# Patient Record
Sex: Male | Born: 1970
Health system: Southern US, Community
[De-identification: ages and names within clinical notes are randomized; demographics above are authoritative.]

## PROBLEM LIST (undated history)

## (undated) DIAGNOSIS — B019 Varicella without complication: Secondary | ICD-10-CM

## (undated) HISTORY — PX: VASECTOMY: SHX75

## (undated) HISTORY — DX: Varicella without complication: B01.9

---

## 2005-03-12 ENCOUNTER — Emergency Department (HOSPITAL_COMMUNITY): Admission: EM | Admit: 2005-03-12 | Discharge: 2005-03-12 | Payer: Self-pay | Admitting: Emergency Medicine

## 2005-12-23 ENCOUNTER — Emergency Department (HOSPITAL_COMMUNITY): Admission: EM | Admit: 2005-12-23 | Discharge: 2005-12-23 | Payer: Self-pay | Admitting: Emergency Medicine

## 2006-05-23 ENCOUNTER — Emergency Department (HOSPITAL_COMMUNITY): Admission: EM | Admit: 2006-05-23 | Discharge: 2006-05-23 | Payer: Self-pay | Admitting: *Deleted

## 2006-12-17 ENCOUNTER — Emergency Department (HOSPITAL_COMMUNITY): Admission: EM | Admit: 2006-12-17 | Discharge: 2006-12-17 | Payer: Self-pay | Admitting: Emergency Medicine

## 2009-03-13 ENCOUNTER — Emergency Department (HOSPITAL_BASED_OUTPATIENT_CLINIC_OR_DEPARTMENT_OTHER): Admission: EM | Admit: 2009-03-13 | Discharge: 2009-03-13 | Payer: Self-pay | Admitting: Emergency Medicine

## 2009-04-12 ENCOUNTER — Emergency Department (HOSPITAL_BASED_OUTPATIENT_CLINIC_OR_DEPARTMENT_OTHER): Admission: EM | Admit: 2009-04-12 | Discharge: 2009-04-12 | Payer: Self-pay | Admitting: Emergency Medicine

## 2009-04-15 ENCOUNTER — Emergency Department (HOSPITAL_BASED_OUTPATIENT_CLINIC_OR_DEPARTMENT_OTHER): Admission: EM | Admit: 2009-04-15 | Discharge: 2009-04-15 | Payer: Self-pay | Admitting: Emergency Medicine

## 2010-06-17 LAB — URINE CULTURE: Colony Count: NO GROWTH

## 2010-06-17 LAB — URINALYSIS, ROUTINE W REFLEX MICROSCOPIC
Glucose, UA: NEGATIVE mg/dL
Ketones, ur: NEGATIVE mg/dL
Nitrite: NEGATIVE
Protein, ur: NEGATIVE mg/dL
pH: 5.5 (ref 5.0–8.0)

## 2010-07-03 LAB — RAPID STREP SCREEN (MED CTR MEBANE ONLY): Streptococcus, Group A Screen (Direct): POSITIVE — AB

## 2012-08-05 ENCOUNTER — Ambulatory Visit: Payer: Self-pay | Admitting: Family Medicine

## 2012-08-13 ENCOUNTER — Telehealth: Payer: Self-pay | Admitting: *Deleted

## 2012-08-13 NOTE — Telephone Encounter (Signed)
Attempted to call pt to move appt time to earlier slot, lmovm for the pt to return my call. Offered 1pm and 2pm slot, okay to combine to 15 minutes slots for this pt per Dr. Beverely Low.

## 2012-08-14 ENCOUNTER — Ambulatory Visit: Payer: Self-pay | Admitting: Family Medicine

## 2012-08-21 ENCOUNTER — Encounter: Payer: Self-pay | Admitting: Family Medicine

## 2012-08-21 ENCOUNTER — Ambulatory Visit (INDEPENDENT_AMBULATORY_CARE_PROVIDER_SITE_OTHER): Payer: BC Managed Care – PPO | Admitting: Family Medicine

## 2012-08-21 VITALS — BP 130/80 | HR 72 | Temp 98.2°F | Ht 65.0 in | Wt 159.6 lb

## 2012-08-21 DIAGNOSIS — G4762 Sleep related leg cramps: Secondary | ICD-10-CM

## 2012-08-21 DIAGNOSIS — M25519 Pain in unspecified shoulder: Secondary | ICD-10-CM | POA: Insufficient documentation

## 2012-08-21 LAB — CBC WITH DIFFERENTIAL/PLATELET
Eosinophils Absolute: 0.1 10*3/uL (ref 0.0–0.7)
HCT: 45.8 % (ref 39.0–52.0)
Lymphs Abs: 3.1 10*3/uL (ref 0.7–4.0)
MCH: 33.4 pg (ref 26.0–34.0)
Monocytes Absolute: 0.8 10*3/uL (ref 0.1–1.0)
Neutro Abs: 4.7 10*3/uL (ref 1.7–7.7)
Neutrophils Relative %: 53 % (ref 43–77)
RBC: 4.82 MIL/uL (ref 4.22–5.81)
RDW: 13.6 % (ref 11.5–15.5)
WBC: 8.7 10*3/uL (ref 4.0–10.5)

## 2012-08-21 LAB — BASIC METABOLIC PANEL
Chloride: 107 mEq/L (ref 96–112)
Potassium: 4.2 mEq/L (ref 3.5–5.3)
Sodium: 140 mEq/L (ref 135–145)

## 2012-08-21 LAB — HEPATIC FUNCTION PANEL
ALT: 22 U/L (ref 0–53)
Albumin: 4.8 g/dL (ref 3.5–5.2)
Alkaline Phosphatase: 41 U/L (ref 39–117)
Bilirubin, Direct: 0.1 mg/dL (ref 0.0–0.3)
Total Bilirubin: 0.4 mg/dL (ref 0.3–1.2)
Total Protein: 7.5 g/dL (ref 6.0–8.3)

## 2012-08-21 MED ORDER — MELOXICAM 15 MG PO TABS: 15.0000 mg | ORAL_TABLET | Freq: Every day | ORAL | Status: DC

## 2012-08-21 NOTE — Patient Instructions (Addendum)
Schedule your complete physical at your convenience We'll notify you of your lab results Increase your water intake Increase your potassium- leafy greens, citrus fruits, bananas, etc Take the Mobic at dinner or before bed- take w/ food Heating pad as needed Try and improve your posture to take the pressure off your low back Call with any questions or concerns Welcome!  We're glad to have you! Have a great holiday!

## 2012-08-21 NOTE — Progress Notes (Signed)
  Subjective:    Patient ID: Philip Hart, male    DOB: [birth year], 42 y.o.   MRN: [id number]  HPI New to establish.  Previous MD- none.  Bilateral shoulder pain- pt has hx of injury to R shoulder (workman's comp) ~1 yr ago.  Now waking from sleep w/ pain in both arms.  No limitation of motion.  Pain improves once pt is up and moving.  R hand dominant.  No weakness or numbness of arms.  Is a side sleeper, mostly R side.  R leg cramp- occuring at night.  Typically after pointing toes.  No known calf injury.  Working outside regularly or in a warehouse.  Limited fruits and veggies in diet.  Pt drives heavy equipment w/ R leg.   Review of Systems For ROS see HPI     Objective:   Physical Exam  Vitals reviewed. Constitutional: He is oriented to person, place, and time. He appears well-developed and well-nourished. No distress.  Neck: Normal range of motion. Neck supple. No thyromegaly present.  Bilateral trap spasm  Cardiovascular: Normal rate, regular rhythm, normal heart sounds and intact distal pulses.   Pulmonary/Chest: Effort normal and breath sounds normal. No respiratory distress. He has no wheezes. He has no rales.  Musculoskeletal: Normal range of motion. He exhibits no edema and no tenderness (over shoulders bilaterally).  Full ROM of shoulders bilaterally Negative impingement signs bilaterally  Lymphadenopathy:    He has no cervical adenopathy.  Neurological: He is alert and oriented to person, place, and time. He has normal reflexes.  Skin: Skin is warm.          Assessment & Plan:

## 2012-08-21 NOTE — Assessment & Plan Note (Signed)
New.  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  Reviewed importance of K+ in diet.  Check labs.  Will follow.

## 2012-08-21 NOTE — Assessment & Plan Note (Signed)
New.  No pain on PE.  No limitation on ROM.  No impingement.  Suspect pt's AM pain is positional when sleeping.  Will start scheduled NSAID prior to bed and see if sxs improve.  If no better, will refer to ortho.  Pt expressed understanding and is in agreement w/ plan.

## 2012-08-25 ENCOUNTER — Encounter: Payer: Self-pay | Admitting: *Deleted

## 2012-10-15 ENCOUNTER — Ambulatory Visit (INDEPENDENT_AMBULATORY_CARE_PROVIDER_SITE_OTHER): Payer: BC Managed Care – PPO | Admitting: Family Medicine

## 2012-10-15 ENCOUNTER — Encounter: Payer: Self-pay | Admitting: Family Medicine

## 2012-10-15 VITALS — BP 130/80 | HR 66 | Temp 98.7°F | Ht 65.0 in | Wt 152.0 lb

## 2012-10-15 DIAGNOSIS — Z125 Encounter for screening for malignant neoplasm of prostate: Secondary | ICD-10-CM | POA: Insufficient documentation

## 2012-10-15 DIAGNOSIS — Z0000 Encounter for general adult medical examination without abnormal findings: Secondary | ICD-10-CM

## 2012-10-15 DIAGNOSIS — Z13220 Encounter for screening for lipoid disorders: Secondary | ICD-10-CM | POA: Insufficient documentation

## 2012-10-15 LAB — LIPID PANEL
Total CHOL/HDL Ratio: 3
Triglycerides: 80 mg/dL (ref 0.0–149.0)
VLDL: 16 mg/dL (ref 0.0–40.0)

## 2012-10-15 NOTE — Assessment & Plan Note (Signed)
PE WNL.  Reviewed labs from last visit.  Check cholesterol since pt is fasting.  Encouraged smoking cessation.  Anticipatory guidance provided.

## 2012-10-15 NOTE — Patient Instructions (Addendum)
Follow up in 1 year or as needed We'll notify you of your lab results and make any changes if needed Keep up the good work!  You look great! Quit smoking! Call with any questions or concerns Have a great summer!!!

## 2012-10-15 NOTE — Progress Notes (Signed)
  Subjective:    Patient ID: Philip Hart, male    DOB: May 20, 1970, 42 y.o.   MRN: [id number]  HPI CPE- no concerns today.   Review of Systems Patient reports no vision/hearing changes, anorexia, fever ,adenopathy, persistant/recurrent hoarseness, swallowing issues, chest pain, palpitations, edema, persistant/recurrent cough, hemoptysis, dyspnea (rest,exertional, paroxysmal nocturnal), gastrointestinal  bleeding (melena, rectal bleeding), abdominal pain, excessive heart burn, GU symptoms (dysuria, hematuria, voiding/incontinence issues) syncope, focal weakness, memory loss, numbness & tingling, skin/hair/nail changes, depression, anxiety, abnormal bruising/bleeding, musculoskeletal symptoms/signs.     Objective:   Physical Exam BP 130/80  Pulse 66  Temp(Src) 98.7 F (37.1 C) (Oral)  Ht 5\' 5"  (1.651 m)  Wt 152 lb (68.947 kg)  BMI 25.29 kg/m2  SpO2 97%  General Appearance:    Alert, cooperative, no distress, appears stated age  Head:    Normocephalic, without obvious abnormality, atraumatic  Eyes:    PERRL, conjunctiva/corneas clear, EOM's intact, fundi    benign, both eyes       Ears:    Normal TM's and external ear canals, both ears  Nose:   Nares normal, septum midline, mucosa normal, no drainage   or sinus tenderness  Throat:   Lips, mucosa, and tongue normal; teeth and gums normal  Neck:   Supple, symmetrical, trachea midline, no adenopathy;       thyroid:  No enlargement/tenderness/nodules  Back:     Symmetric, no curvature, ROM normal, no CVA tenderness  Lungs:     Clear to auscultation bilaterally, respirations unlabored  Chest wall:    No tenderness or deformity  Heart:    Regular rate and rhythm, S1 and S2 normal, no murmur, rub   or gallop  Abdomen:     Soft, non-tender, bowel sounds active all four quadrants,    no masses, no organomegaly  Genitalia:    Normal male without lesion, discharge or tenderness  Rectal:    Normal tone, normal prostate, no masses or  tenderness  Extremities:   Extremities normal, atraumatic, no cyanosis or edema  Pulses:   2+ and symmetric all extremities  Skin:   Skin color, texture, turgor normal, no rashes or lesions  Lymph nodes:   Cervical, supraclavicular, and axillary nodes normal  Neurologic:   CNII-XII intact. Normal strength, sensation and reflexes      throughout          Assessment & Plan:

## 2012-10-16 ENCOUNTER — Encounter: Payer: Self-pay | Admitting: *Deleted

## 2013-06-28 ENCOUNTER — Ambulatory Visit: Payer: BC Managed Care – PPO | Admitting: Family Medicine

## 2013-07-23 ENCOUNTER — Ambulatory Visit (INDEPENDENT_AMBULATORY_CARE_PROVIDER_SITE_OTHER): Payer: BC Managed Care – PPO | Admitting: Family Medicine

## 2013-07-23 ENCOUNTER — Encounter: Payer: Self-pay | Admitting: Family Medicine

## 2013-07-23 VITALS — BP 130/70 | HR 82 | Temp 99.1°F | Wt 155.0 lb

## 2013-07-23 DIAGNOSIS — R21 Rash and other nonspecific skin eruption: Secondary | ICD-10-CM

## 2013-07-23 MED ORDER — NYSTATIN-TRIAMCINOLONE 100000-0.1 UNIT/GM-% EX OINT: 1.0000 "application " | TOPICAL_OINTMENT | Freq: Two times a day (BID) | CUTANEOUS | Status: DC

## 2013-07-23 NOTE — Patient Instructions (Signed)

## 2013-07-23 NOTE — Progress Notes (Signed)
  Subjective:     Philip Hart is a 43 y.o. male who presents for evaluation of a rash involving the trunk and perirectal. Rash started several months ago.  Rash on chest is a patch of papules-- itchy--no otc med tried.  Rash on rectum in white/ papules Rash has not changed over time. Rash is pruritic. Associated symptoms: none. Patient denies: abdominal pain, arthralgia, congestion, cough, crankiness, decrease in appetite, decrease in energy level, fever, headache, irritability, myalgia, nausea, sore throat and vomiting. Patient has not had contacts with similar rash. Patient has not had new exposures (soaps, lotions, laundry detergents, foods, medications, plants, insects or animals).  The following portions of the patient's history were reviewed and updated as appropriate:  He  has a past medical history of Chicken pox. He  does not have any pertinent problems on file. He  has past surgical history that includes Vasectomy. His family history includes Alcohol abuse in his maternal grandfather; Diabetes in his mother; Heart disease in his father. He  reports that he has been smoking Cigarettes.  He has a 20 pack-year smoking history. He does not have any smokeless tobacco history on file. He reports that he drinks alcohol. He reports that he does not use illicit drugs. He has a current medication list which includes the following prescription(s): ibuprofen and nystatin-triamcinolone ointment. Current Outpatient Prescriptions on File Prior to Visit  Medication Sig Dispense Refill  . ibuprofen (ADVIL,MOTRIN) 200 MG tablet Take 200 mg by mouth every 6 (six) hours as needed for pain.       No current facility-administered medications on file prior to visit.   He has No Known Allergies..  Review of Systems Pertinent items are noted in HPI.    Objective:    BP 130/70  Pulse 82  Temp(Src) 99.1 F (37.3 C) (Oral)  Wt 155 lb (70.308 kg)  SpO2 96% General:  alert, cooperative and no distress   Skin:  papules on chest in a patch-- dry    rectum--+ papular white around rectum    face-- hypopigmentation R cheek  Assessment:    eczema and tinea corporis    Plan:    Medications: triamcinolone/nystatin. Written patient instruction given. Follow up in a few weeks. --prn Encouraged pt to use sunscreen on his face

## 2013-07-23 NOTE — Progress Notes (Signed)
Pre visit review using our clinic review tool, if applicable. No additional management support is needed unless otherwise documented below in the visit note. 

## 2013-07-26 ENCOUNTER — Telehealth: Payer: Self-pay | Admitting: Family Medicine

## 2013-07-26 NOTE — Telephone Encounter (Signed)
Relevant patient education assigned to patient using Emmi. ° °

## 2014-01-15 ENCOUNTER — Ambulatory Visit: Payer: BC Managed Care – PPO | Admitting: Internal Medicine

## 2014-01-18 ENCOUNTER — Ambulatory Visit (INDEPENDENT_AMBULATORY_CARE_PROVIDER_SITE_OTHER): Payer: 59 | Admitting: Family Medicine

## 2014-01-18 ENCOUNTER — Encounter: Payer: Self-pay | Admitting: Family Medicine

## 2014-01-18 VITALS — BP 120/88 | HR 67 | Temp 98.2°F | Resp 16 | Wt 160.1 lb

## 2014-01-18 DIAGNOSIS — H538 Other visual disturbances: Secondary | ICD-10-CM

## 2014-01-18 DIAGNOSIS — G5791 Unspecified mononeuropathy of right lower limb: Secondary | ICD-10-CM

## 2014-01-18 DIAGNOSIS — G5792 Unspecified mononeuropathy of left lower limb: Secondary | ICD-10-CM

## 2014-01-18 DIAGNOSIS — G5793 Unspecified mononeuropathy of bilateral lower limbs: Secondary | ICD-10-CM

## 2014-01-18 LAB — BASIC METABOLIC PANEL
BUN: 11 mg/dL (ref 6–23)
CO2: 21 meq/L (ref 19–32)
Calcium: 10.5 mg/dL (ref 8.4–10.5)
Chloride: 106 mEq/L (ref 96–112)
Creatinine, Ser: 1.4 mg/dL (ref 0.4–1.5)
GFR: 60.44 mL/min (ref >60.00)
Glucose, Bld: 95 mg/dL (ref 70–99)
POTASSIUM: 4.7 meq/L (ref 3.5–5.1)
SODIUM: 142 meq/L (ref 135–145)

## 2014-01-18 LAB — TSH: TSH: 1.32 u[IU]/mL (ref 0.35–4.50)

## 2014-01-18 LAB — CBC WITH DIFFERENTIAL/PLATELET
BASOS ABS: 0 10*3/uL (ref 0.0–0.1)
BASOS PCT: 0.6 % (ref 0.0–3.0)
EOS ABS: 0.1 10*3/uL (ref 0.0–0.7)
EOS PCT: 0.9 % (ref 0.0–5.0)
HCT: 51.3 % (ref 39.0–52.0)
Hemoglobin: 17.1 g/dL — ABNORMAL HIGH (ref 13.0–17.0)
LYMPHS ABS: 2.4 10*3/uL (ref 0.7–4.0)
LYMPHS PCT: 38.4 % (ref 12.0–46.0)
MCHC: 33.3 g/dL (ref 30.0–36.0)
MCV: 98.8 fl (ref 78.0–100.0)
MONO ABS: 0.7 10*3/uL (ref 0.1–1.0)
Monocytes Relative: 11 % (ref 3.0–12.0)
NEUTROS PCT: 49.1 % (ref 43.0–77.0)
Neutro Abs: 3.1 10*3/uL (ref 1.4–7.7)
Platelets: 157 10*3/uL (ref 150.0–400.0)
RBC: 5.19 Mil/uL (ref 4.22–5.81)
RDW: 13.2 % (ref 11.5–15.5)
WBC: 6.3 10*3/uL (ref 4.0–10.5)

## 2014-01-18 LAB — HEMOGLOBIN A1C: HEMOGLOBIN A1C: 5.7 % (ref 4.6–6.5)

## 2014-01-18 LAB — B12 AND FOLATE PANEL
Folate: 9 ng/mL (ref >5.9)
Vitamin B-12: 347 pg/mL (ref 211–911)

## 2014-01-18 MED ORDER — PREDNISONE 10 MG PO TABS: ORAL_TABLET | ORAL | Status: DC

## 2014-01-18 NOTE — Assessment & Plan Note (Signed)
New.  Pt's acute onset and complete resolution is concerning.  Differential dx is broad including TIA, MS, and other neuro conditions.  Despite normal vision and pt being asymptomatic since episode last week, urgent evaluation by ophtho is recommended.  Pt expressed understanding and is in agreement w/ plan.

## 2014-01-18 NOTE — Assessment & Plan Note (Signed)
New.  Suspect this is meralgia paresthetica due to harness worn during hunting.  No red flags on PE- no muscle weakness, abnormal reflexes, abnormal gait.  Check labs to r/o other cause of neuropathy- DM, electrolyte abnormality, anemia, B12/folate deficiency.  Start short pred taper to relieve inflammation.  If no improvement, pt to call for referral to neuro.  Pt expressed understanding and is in agreement w/ plan.

## 2014-01-18 NOTE — Patient Instructions (Signed)
Schedule your complete physical in 3-4 months We'll notify you of your lab results and make any changes if needed Stop up front and they will give you the place and time of your eye exam Start the Prednisone as directed- take w/ food to avoid upset stomach Call with any questions or concerns Hang in there!!!

## 2014-01-18 NOTE — Progress Notes (Signed)
   Subjective:    Patient ID: Philip Hart, male    DOB: [birth year], 43 y.o.   MRN: [id number]  HPI Bilateral leg pain- pt reports bilateral thigh pain, described as a burning that radiates between lower and upper thighs.  Intermittently will have sharp, shooting pains, 'like being stabbed from the inside'.  Painful to have pants rub against leg.  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  Blurry vision- pt reports that while driving last week developed sudden onset blurry vision, sxs resolved within the hour.  Blurry vision was bilateral.  No eye pain.  No eye injury or bright lights.  Blurry vision has not recurred.   Review of Systems For ROS see HPI     Objective:   Physical Exam  Vitals reviewed. Constitutional: He is oriented to person, place, and time. He appears well-developed and well-nourished. No distress.  HENT:  Head: Normocephalic and atraumatic.  Eyes: Conjunctivae and EOM are normal. Pupils are equal, round, and reactive to light. Right eye exhibits no discharge. Left eye exhibits no discharge.  Vision 20/25 bilaterally  Cardiovascular: Intact distal pulses.   Musculoskeletal: He exhibits no edema.  Neurological: He is alert and oriented to person, place, and time. He has normal reflexes. No cranial nerve deficit. Coordination normal.  TTP w/ light touch over lateral thighs bilaterally constant w/ lateral cutaneous nerve distribution Strength of hamstrings and quads 5/5 bilaterally  Skin: Skin is warm and dry.          Assessment & Plan:

## 2014-01-18 NOTE — Progress Notes (Signed)
Pre visit review using our clinic review tool, if applicable. No additional management support is needed unless otherwise documented below in the visit note. 

## 2014-01-19 ENCOUNTER — Telehealth: Payer: Self-pay | Admitting: Family Medicine

## 2014-01-19 ENCOUNTER — Other Ambulatory Visit: Payer: Self-pay | Admitting: Family Medicine

## 2014-01-19 DIAGNOSIS — D582 Other hemoglobinopathies: Secondary | ICD-10-CM

## 2014-01-19 NOTE — Telephone Encounter (Signed)
Pt is returning your call, states you can leave him a message on his vm with his labs results.

## 2014-01-19 NOTE — Telephone Encounter (Signed)
Caller name: Molli HazardMatthew  Call back number: [phone]   Reason for call:  Pt would like lab results.   Pt would like to know how long the Rx predniSONE (DELTASONE) 10 MG tablet takes to work.

## 2014-01-19 NOTE — Telephone Encounter (Signed)
Pt notified of results and advised that prednisone can take a day or two to begin working.

## 2014-02-02 ENCOUNTER — Other Ambulatory Visit: Payer: 59

## 2014-05-26 ENCOUNTER — Encounter: Payer: 59 | Admitting: Family Medicine

## 2014-06-10 ENCOUNTER — Encounter: Payer: 59 | Admitting: Family Medicine

## 2014-09-07 ENCOUNTER — Telehealth: Payer: Self-pay | Admitting: Family Medicine

## 2014-09-07 NOTE — Telephone Encounter (Signed)
Pre Visit letter sent  °

## 2014-09-26 ENCOUNTER — Telehealth: Payer: Self-pay | Admitting: Behavioral Health

## 2014-09-26 NOTE — Telephone Encounter (Signed)
Unable to reach patient at time of Pre-Visit Call.  Left message for patient to return call when available.    

## 2014-09-27 ENCOUNTER — Encounter: Payer: Self-pay | Admitting: Family Medicine

## 2014-09-27 ENCOUNTER — Ambulatory Visit (INDEPENDENT_AMBULATORY_CARE_PROVIDER_SITE_OTHER): Payer: 59 | Admitting: Family Medicine

## 2014-09-27 VITALS — BP 124/82 | HR 71 | Temp 98.1°F | Resp 16 | Ht 65.0 in | Wt 152.0 lb

## 2014-09-27 DIAGNOSIS — Z0000 Encounter for general adult medical examination without abnormal findings: Secondary | ICD-10-CM

## 2014-09-27 LAB — CBC WITH DIFFERENTIAL/PLATELET
BASOS PCT: 0.6 % (ref 0.0–3.0)
Basophils Absolute: 0 10*3/uL (ref 0.0–0.1)
EOS PCT: 0.9 % (ref 0.0–5.0)
Eosinophils Absolute: 0.1 10*3/uL (ref 0.0–0.7)
HCT: 50 % (ref 39.0–52.0)
Hemoglobin: 17.1 g/dL — ABNORMAL HIGH (ref 13.0–17.0)
Lymphocytes Relative: 32.1 % (ref 12.0–46.0)
Lymphs Abs: 2.3 10*3/uL (ref 0.7–4.0)
MCHC: 34.2 g/dL (ref 30.0–36.0)
MCV: 97.4 fl (ref 78.0–100.0)
MONO ABS: 0.7 10*3/uL (ref 0.1–1.0)
Monocytes Relative: 10.4 % (ref 3.0–12.0)
NEUTROS PCT: 56 % (ref 43.0–77.0)
Neutro Abs: 4 10*3/uL (ref 1.4–7.7)
Platelets: 128 10*3/uL — ABNORMAL LOW (ref 150.0–400.0)
RBC: 5.14 Mil/uL (ref 4.22–5.81)
RDW: 13.1 % (ref 11.5–15.5)
WBC: 7.2 10*3/uL (ref 4.0–10.5)

## 2014-09-27 LAB — LIPID PANEL
CHOLESTEROL: 211 mg/dL — AB (ref 0–200)
HDL: 62.2 mg/dL (ref >39.00)
LDL Cholesterol: 118 mg/dL — ABNORMAL HIGH (ref 0–99)
NONHDL: 148.8
Total CHOL/HDL Ratio: 3
Triglycerides: 156 mg/dL — ABNORMAL HIGH (ref 0.0–149.0)
VLDL: 31.2 mg/dL (ref 0.0–40.0)

## 2014-09-27 LAB — BASIC METABOLIC PANEL
BUN: 9 mg/dL (ref 6–23)
CALCIUM: 9.8 mg/dL (ref 8.4–10.5)
CO2: 29 mEq/L (ref 19–32)
CREATININE: 1.06 mg/dL (ref 0.40–1.50)
Chloride: 101 mEq/L (ref 96–112)
GFR: 81 mL/min (ref >60.00)
Glucose, Bld: 95 mg/dL (ref 70–99)
Potassium: 4.4 mEq/L (ref 3.5–5.1)
SODIUM: 136 meq/L (ref 135–145)

## 2014-09-27 LAB — HEPATIC FUNCTION PANEL
ALT: 24 U/L (ref 0–53)
AST: 22 U/L (ref 0–37)
Albumin: 4.6 g/dL (ref 3.5–5.2)
Alkaline Phosphatase: 47 U/L (ref 39–117)
BILIRUBIN DIRECT: 0.1 mg/dL (ref 0.0–0.3)
BILIRUBIN TOTAL: 0.6 mg/dL (ref 0.2–1.2)
TOTAL PROTEIN: 8 g/dL (ref 6.0–8.3)

## 2014-09-27 LAB — TSH: TSH: 1.74 u[IU]/mL (ref 0.35–4.50)

## 2014-09-27 MED ORDER — TRIAMCINOLONE ACETONIDE 0.1 % EX OINT: 1.0000 "application " | TOPICAL_OINTMENT | Freq: Two times a day (BID) | CUTANEOUS | Status: DC

## 2014-09-27 NOTE — Patient Instructions (Signed)
Follow up in 1 year or as needed We'll notify you of your lab results and make any changes if needed Use the cream for the rash on the chest twice daily until it improves and then as needed if it returns QUIT SMOKING!! Call with any questions or concerns Have a great summer!!!

## 2014-09-27 NOTE — Progress Notes (Signed)
   Subjective:    Patient ID: Philip Hart, male    DOB: [birth year], 44 y.o.   MRN: [id number]  HPI CPE- no concerns today.   Review of Systems Patient reports no vision/hearing changes, anorexia, fever ,adenopathy, persistant/recurrent hoarseness, swallowing issues, chest pain, palpitations, edema, persistant/recurrent cough, hemoptysis, dyspnea (rest,exertional, paroxysmal nocturnal), gastrointestinal  bleeding (melena, rectal bleeding), abdominal pain, excessive heart burn, GU symptoms (dysuria, hematuria, voiding/incontinence issues) syncope, focal weakness, memory loss, numbness & tingling, hair/nail changes, depression, anxiety, abnormal bruising/bleeding, musculoskeletal symptoms/signs.   + rash on central chest- appears in heat and sweat, not itchy.    Objective:   Physical Exam General Appearance:    Alert, cooperative, no distress, appears stated age  Head:    Normocephalic, without obvious abnormality, atraumatic  Eyes:    PERRL, conjunctiva/corneas clear, EOM's intact, fundi    benign, both eyes       Ears:    Normal TM's and external ear canals, both ears  Nose:   Nares normal, septum midline, mucosa normal, no drainage   or sinus tenderness  Throat:   Lips, mucosa, and tongue normal; teeth and gums normal  Neck:   Supple, symmetrical, trachea midline, no adenopathy;       thyroid:  No enlargement/tenderness/nodules  Back:     Symmetric, no curvature, ROM normal, no CVA tenderness  Lungs:     Clear to auscultation bilaterally, respirations unlabored  Chest wall:    No tenderness or deformity  Heart:    Regular rate and rhythm, S1 and S2 normal, no murmur, rub   or gallop  Abdomen:     Soft, non-tender, bowel sounds active all four quadrants,    no masses, no organomegaly  Genitalia:    Normal male without lesion, masses,discharge or tenderness  Rectal:    Deferred due to young age  Extremities:   Extremities normal, atraumatic, no cyanosis or edema  Pulses:   2+ and  symmetric all extremities  Skin:   Skin color, texture, turgor normal, localized eczematous rash on central chest  Lymph nodes:   Cervical, supraclavicular, and axillary nodes normal  Neurologic:   CNII-XII intact. Normal strength, sensation and reflexes      throughout          Assessment & Plan:

## 2014-09-27 NOTE — Progress Notes (Signed)
Pre visit review using our clinic review tool, if applicable. No additional management support is needed unless otherwise documented below in the visit note. 

## 2014-09-28 ENCOUNTER — Encounter: Payer: Self-pay | Admitting: General Practice

## 2014-09-28 NOTE — Assessment & Plan Note (Signed)
Pt's PE WNL w/ exception of rash on central chest.  Start topical steroid ointment for what appears to be eczema.  Check labs.  Again encouraged smoking cessation.  Anticipatory guidance provided.

## 2015-06-21 DIAGNOSIS — L219 Seborrheic dermatitis, unspecified: Secondary | ICD-10-CM | POA: Diagnosis not present

## 2015-06-21 MED FILL — KETOCONAZOLE 2% SHAMPOO: 2 | 20 days supply | Qty: 120 | Fill #0

## 2015-06-21 MED FILL — CICLOPIROX 0.77% CREAM: 0.77 | 30 days supply | Qty: 90 | Fill #0

## 2015-06-27 MED FILL — SSD 1% CREAM: 1 | 30 days supply | Qty: 50 | Fill #0

## 2015-07-18 ENCOUNTER — Ambulatory Visit (INDEPENDENT_AMBULATORY_CARE_PROVIDER_SITE_OTHER): Payer: 59 | Admitting: Internal Medicine

## 2015-07-18 ENCOUNTER — Encounter: Payer: Self-pay | Admitting: Internal Medicine

## 2015-07-18 ENCOUNTER — Other Ambulatory Visit (INDEPENDENT_AMBULATORY_CARE_PROVIDER_SITE_OTHER): Payer: 59

## 2015-07-18 VITALS — BP 132/82 | HR 66 | Temp 98.4°F | Resp 18 | Ht 63.0 in | Wt 145.0 lb

## 2015-07-18 DIAGNOSIS — Z0000 Encounter for general adult medical examination without abnormal findings: Secondary | ICD-10-CM

## 2015-07-18 DIAGNOSIS — Z720 Tobacco use: Secondary | ICD-10-CM | POA: Diagnosis not present

## 2015-07-18 LAB — COMPREHENSIVE METABOLIC PANEL
ALBUMIN: 4.7 g/dL (ref 3.5–5.2)
ALK PHOS: 44 U/L (ref 39–117)
ALT: 17 U/L (ref 0–53)
AST: 19 U/L (ref 0–37)
BILIRUBIN TOTAL: 0.6 mg/dL (ref 0.2–1.2)
BUN: 19 mg/dL (ref 6–23)
CO2: 31 mEq/L (ref 19–32)
CREATININE: 1.31 mg/dL (ref 0.40–1.50)
Calcium: 10.1 mg/dL (ref 8.4–10.5)
Chloride: 102 mEq/L (ref 96–112)
GFR: 63.2 mL/min (ref >60.00)
GLUCOSE: 101 mg/dL — AB (ref 70–99)
Potassium: 4.3 mEq/L (ref 3.5–5.1)
SODIUM: 139 meq/L (ref 135–145)
TOTAL PROTEIN: 8 g/dL (ref 6.0–8.3)

## 2015-07-18 LAB — CBC
HEMATOCRIT: 48.5 % (ref 39.0–52.0)
HEMOGLOBIN: 16.9 g/dL (ref 13.0–17.0)
MCHC: 34.8 g/dL (ref 30.0–36.0)
MCV: 96.2 fl (ref 78.0–100.0)
PLATELETS: 137 10*3/uL — AB (ref 150.0–400.0)
RBC: 5.04 Mil/uL (ref 4.22–5.81)
RDW: 13 % (ref 11.5–15.5)
WBC: 10 10*3/uL (ref 4.0–10.5)

## 2015-07-18 LAB — LIPID PANEL
CHOLESTEROL: 207 mg/dL — AB (ref 0–200)
HDL: 70.1 mg/dL (ref >39.00)
LDL Cholesterol: 121 mg/dL — ABNORMAL HIGH (ref 0–99)
NONHDL: 137.29
Total CHOL/HDL Ratio: 3
Triglycerides: 80 mg/dL (ref 0.0–149.0)
VLDL: 16 mg/dL (ref 0.0–40.0)

## 2015-07-18 LAB — TSH: TSH: 2.29 u[IU]/mL (ref 0.35–4.50)

## 2015-07-18 NOTE — Progress Notes (Signed)
Pre visit review using our clinic review tool, if applicable. No additional management support is needed unless otherwise documented below in the visit note. 

## 2015-07-18 NOTE — Patient Instructions (Signed)
We have checked the EKG of the heart which shows that it looks perfectly normal. We are checking the labs today and call you back about the results.   Think about quitting smoker as this is one of the biggest causes of heart attack and stroke for you.   Think about using a goal for money that you are spending on cigarettes to treat yourself.  Smoking Cessation, Tips for Success If you are ready to quit smoking, congratulations! You have chosen to help yourself be healthier. Cigarettes bring nicotine, tar, carbon monoxide, and other irritants into your body. Your lungs, heart, and blood vessels will be able to work better without these poisons. There are many different ways to quit smoking. Nicotine gum, nicotine patches, a nicotine inhaler, or nicotine nasal spray can help with physical craving. Hypnosis, support groups, and medicines help break the habit of smoking. WHAT THINGS CAN I DO TO MAKE QUITTING EASIER?  Here are some tips to help you quit for good:  Pick a date when you will quit smoking completely. Tell all of your friends and family about your plan to quit on that date.  Do not try to slowly cut down on the number of cigarettes you are smoking. Pick a quit date and quit smoking completely starting on that day.  Throw away all cigarettes.   Clean and remove all ashtrays from your home, work, and car.  On a card, write down your reasons for quitting. Carry the card with you and read it when you get the urge to smoke.  Cleanse your body of nicotine. Drink enough water and fluids to keep your urine clear or pale yellow. Do this after quitting to flush the nicotine from your body.  Learn to predict your moods. Do not let a bad situation be your excuse to have a cigarette. Some situations in your life might tempt you into wanting a cigarette.  Never have "just one" cigarette. It leads to wanting another and another. Remind yourself of your decision to quit.  Change habits associated  with smoking. If you smoked while driving or when feeling stressed, try other activities to replace smoking. Stand up when drinking your coffee. Brush your teeth after eating. Sit in a different chair when you read the paper. Avoid alcohol while trying to quit, and try to drink fewer caffeinated beverages. Alcohol and caffeine may urge you to smoke.  Avoid foods and drinks that can trigger a desire to smoke, such as sugary or spicy foods and alcohol.  Ask people who smoke not to smoke around you.  Have something planned to do right after eating or having a cup of coffee. For example, plan to take a walk or exercise.  Try a relaxation exercise to calm you down and decrease your stress. Remember, you may be tense and nervous for the first 2 weeks after you quit, but this will pass.  Find new activities to keep your hands busy. Play with a pen, coin, or rubber band. Doodle or draw things on paper.  Brush your teeth right after eating. This will help cut down on the craving for the taste of tobacco after meals. You can also try mouthwash.   Use oral substitutes in place of cigarettes. Try using lemon drops, carrots, cinnamon sticks, or chewing gum. Keep them handy so they are available when you have the urge to smoke.  When you have the urge to smoke, try deep breathing.  Designate your home as a nonsmoking area.  If you are a heavy smoker, ask your health care provider about a prescription for nicotine chewing gum. It can ease your withdrawal from nicotine.  Reward yourself. Set aside the cigarette money you save and buy yourself something nice.  Look for support from others. Join a support group or smoking cessation program. Ask someone at home or at work to help you with your plan to quit smoking.  Always ask yourself, "Do I need this cigarette or is this just a reflex?" Tell yourself, "Today, I choose not to smoke," or "I do not want to smoke." You are reminding yourself of your decision  to quit.  Do not replace cigarette smoking with electronic cigarettes (commonly called e-cigarettes). The safety of e-cigarettes is unknown, and some may contain harmful chemicals.  If you relapse, do not give up! Plan ahead and think about what you will do the next time you get the urge to smoke. HOW WILL I FEEL WHEN I QUIT SMOKING? You may have symptoms of withdrawal because your body is used to nicotine (the addictive substance in cigarettes). You may crave cigarettes, be irritable, feel very hungry, cough often, get headaches, or have difficulty concentrating. The withdrawal symptoms are only temporary. They are strongest when you first quit but will go away within 10-14 days. When withdrawal symptoms occur, stay in control. Think about your reasons for quitting. Remind yourself that these are signs that your body is healing and getting used to being without cigarettes. Remember that withdrawal symptoms are easier to treat than the major diseases that smoking can cause.  Even after the withdrawal is over, expect periodic urges to smoke. However, these cravings are generally short lived and will go away whether you smoke or not. Do not smoke! WHAT RESOURCES ARE AVAILABLE TO HELP ME QUIT SMOKING? Your health care provider can direct you to community resources or hospitals for support, which may include:  Group support.  Education.  Hypnosis.  Therapy.   This information is not intended to replace advice given to you by your health care provider. Make sure you discuss any questions you have with your health care provider.   Document Released: 12/15/2003 Document Revised: 04/08/2014 Document Reviewed: 09/03/2012 Elsevier Interactive Patient Education 2016 [employer]. Health Maintenance, Male A healthy lifestyle and preventative care can promote health and wellness.  Maintain regular health, dental, and eye exams.  Eat a healthy diet. Foods like vegetables, fruits, whole grains, low-fat  dairy products, and lean protein foods contain the nutrients you need and are low in calories. Decrease your intake of foods high in solid fats, added sugars, and salt. Get information about a proper diet from your health care provider, if necessary.  Regular physical exercise is one of the most important things you can do for your health. Most adults should get at least 150 minutes of moderate-intensity exercise (any activity that increases your heart rate and causes you to sweat) each week. In addition, most adults need muscle-strengthening exercises on 2 or more days a week.   Maintain a healthy weight. The body mass index (BMI) is a screening tool to identify possible weight problems. It provides an estimate of body fat based on height and weight. Your health care provider can find your BMI and can help you achieve or maintain a healthy weight. For males 20 years and older:  A BMI below 18.5 is considered underweight.  A BMI of 18.5 to 24.9 is normal.  A BMI of 25 to 29.9 is considered  overweight.  A BMI of 30 and above is considered obese.  Maintain normal blood lipids and cholesterol by exercising and minimizing your intake of saturated fat. Eat a balanced diet with plenty of fruits and vegetables. Blood tests for lipids and cholesterol should begin at age 44 and be repeated every 5 years. If your lipid or cholesterol levels are high, you are over age 69, or you are at high risk for heart disease, you may need your cholesterol levels checked more frequently.Ongoing high lipid and cholesterol levels should be treated with medicines if diet and exercise are not working.  If you smoke, find out from your health care provider how to quit. If you do not use tobacco, do not start.  Lung cancer screening is recommended for adults aged 55-80 years who are at high risk for developing lung cancer because of a history of smoking. A yearly low-dose CT scan of the lungs is recommended for people who  have at least a 30-pack-year history of smoking and are current smokers or have quit within the past 15 years. A pack year of smoking is smoking an average of 1 pack of cigarettes a day for 1 year (for example, a 30-pack-year history of smoking could mean smoking 1 pack a day for 30 years or 2 packs a day for 15 years). Yearly screening should continue until the smoker has stopped smoking for at least 15 years. Yearly screening should be stopped for people who develop a health problem that would prevent them from having lung cancer treatment.  If you choose to drink alcohol, do not have more than 2 drinks per day. One drink is considered to be 12 oz (360 mL) of beer, 5 oz (150 mL) of wine, or 1.5 oz (45 mL) of liquor.  Avoid the use of street drugs. Do not share needles with anyone. Ask for help if you need support or instructions about stopping the use of drugs.  High blood pressure causes heart disease and increases the risk of stroke. High blood pressure is more likely to develop in:  People who have blood pressure in the end of the normal range (100-139/85-89 mm Hg).  People who are overweight or obese.  People who are African American.  If you are 65-90 years of age, have your blood pressure checked every 3-5 years. If you are 60 years of age or older, have your blood pressure checked every year. You should have your blood pressure measured twice--once when you are at a hospital or clinic, and once when you are not at a hospital or clinic. Record the average of the two measurements. To check your blood pressure when you are not at a hospital or clinic, you can use:  An automated blood pressure machine at a pharmacy.  A home blood pressure monitor.  If you are 68-61 years old, ask your health care provider if you should take aspirin to prevent heart disease.  Diabetes screening involves taking a blood sample to check your fasting blood sugar level. This should be done once every 3 years  after age 33 if you are at a normal weight and without risk factors for diabetes. Testing should be considered at a younger age or be carried out more frequently if you are overweight and have at least 1 risk factor for diabetes.  Colorectal cancer can be detected and often prevented. Most routine colorectal cancer screening begins at the age of 60 and continues through age 70. However, your health care  provider may recommend screening at an earlier age if you have risk factors for colon cancer. On a yearly basis, your health care provider may provide home test kits to check for hidden blood in the stool. A small camera at the end of a tube may be used to directly examine the colon (sigmoidoscopy or colonoscopy) to detect the earliest forms of colorectal cancer. Talk to your health care provider about this at age 38 when routine screening begins. A direct exam of the colon should be repeated every 5-10 years through age 47, unless early forms of precancerous polyps or small growths are found.  People who are at an increased risk for hepatitis B should be screened for this virus. You are considered at high risk for hepatitis B if:  You were born in a country where hepatitis B occurs often. Talk with your health care provider about which countries are considered high risk.  Your parents were born in a high-risk country and you have not received a shot to protect against hepatitis B (hepatitis B vaccine).  You have HIV or AIDS.  You use needles to inject street drugs.  You live with, or have sex with, someone who has hepatitis B.  You are a man who has sex with other men (MSM).  You get hemodialysis treatment.  You take certain medicines for conditions like cancer, organ transplantation, and autoimmune conditions.  Hepatitis C blood testing is recommended for all people born from [birth year] through 1964 and any individual with known risk factors for hepatitis C.  Healthy men should no longer receive  prostate-specific antigen (PSA) blood tests as part of routine cancer screening. Talk to your health care provider about prostate cancer screening.  Testicular cancer screening is not recommended for adolescents or adult males who have no symptoms. Screening includes self-exam, a health care provider exam, and other screening tests. Consult with your health care provider about any symptoms you have or any concerns you have about testicular cancer.  Practice safe sex. Use condoms and avoid high-risk sexual practices to reduce the spread of sexually transmitted infections (STIs).  You should be screened for STIs, including gonorrhea and chlamydia if:  You are sexually active and are younger than 24 years.  You are older than 24 years, and your health care provider tells you that you are at risk for this type of infection.  Your sexual activity has changed since you were last screened, and you are at an increased risk for chlamydia or gonorrhea. Ask your health care provider if you are at risk.  If you are at risk of being infected with HIV, it is recommended that you take a prescription medicine daily to prevent HIV infection. This is called pre-exposure prophylaxis (PrEP). You are considered at risk if:  You are a man who has sex with other men (MSM).  You are a heterosexual man who is sexually active with multiple partners.  You take drugs by injection.  You are sexually active with a partner who has HIV.  Talk with your health care provider about whether you are at high risk of being infected with HIV. If you choose to begin PrEP, you should first be tested for HIV. You should then be tested every 3 months for as long as you are taking PrEP.  Use sunscreen. Apply sunscreen liberally and repeatedly throughout the day. You should seek shade when your shadow is shorter than you. Protect yourself by wearing long sleeves, pants, a wide-brimmed hat, and  sunglasses year round whenever you are  outdoors.  Tell your health care provider of new moles or changes in moles, especially if there is a change in shape or color. Also, tell your health care provider if a mole is larger than the size of a pencil eraser.  A one-time screening for abdominal aortic aneurysm (AAA) and surgical repair of large AAAs by ultrasound is recommended for men aged 65-75 years who are current or former smokers.  Stay current with your vaccines (immunizations).   This information is not intended to replace advice given to you by your health care provider. Make sure you discuss any questions you have with your health care provider.   Document Released: 09/14/2007 Document Revised: 04/08/2014 Document Reviewed: 08/13/2010 Elsevier Interactive Patient Education [employer]2016 Elsevier Inc.

## 2015-07-19 DIAGNOSIS — Z720 Tobacco use: Secondary | ICD-10-CM | POA: Insufficient documentation

## 2015-07-19 NOTE — Assessment & Plan Note (Signed)
Smoking 1 PPD since teens, does not want to try to quit at this time. Reminded him about the risks and harms from tobacco smoke. He has tried patches and gum in the past. Thinks he tried chantix but only for a week or so and did not keep taking it.

## 2015-07-19 NOTE — Progress Notes (Signed)
   Subjective:    Patient ID: Philip Hart, male    DOB: [birth year], 45 y.o.   MRN: [id number]  HPI The patient is a 45 YO man coming in for wellness. Smoking about 1 PPD and has tried to quit in the past. Does not feel like trying to quit now. Denies new problems, not exercising much outside of work.   [facility], social history reviewed and updated.   Review of Systems  Constitutional: Negative for fever, activity change, appetite change, fatigue and unexpected weight change.  HENT: Negative.   Eyes: Negative.   Respiratory: Negative for cough, chest tightness, shortness of breath and wheezing.   Cardiovascular: Negative for chest pain, palpitations and leg swelling.  Gastrointestinal: Negative for nausea, vomiting, abdominal pain, diarrhea, constipation and abdominal distention.  Musculoskeletal: Negative.   Skin: Negative.   Neurological: Negative.   Psychiatric/Behavioral: Negative.       Objective:   Physical Exam  Constitutional: He is oriented to person, place, and time. He appears well-developed and well-nourished.  HENT:  Head: Normocephalic and atraumatic.  Eyes: EOM are normal.  Neck: Normal range of motion.  Cardiovascular: Normal rate and regular rhythm.   Pulmonary/Chest: Effort normal and breath sounds normal. No respiratory distress. He has no wheezes. He has no rales.  Abdominal: Soft. Bowel sounds are normal. He exhibits no distension. There is no tenderness. There is no rebound.  Musculoskeletal: He exhibits no edema.  Neurological: He is alert and oriented to person, place, and time. Coordination normal.  Skin: Skin is warm and dry.  Psychiatric: He has a normal mood and affect.   Filed Vitals:   07/18/15 1615  BP: 132/82  Pulse: 66  Temp: 98.4 F (36.9 C)  TempSrc: Oral  Resp: 18  Height: 5\' 3"  (1.6 m)  Weight: 145 lb (65.772 kg)  SpO2: 98%   EKG: Rate 54, axis normal, intervals normal, sinus, no ST or t wave changes. No old to compare.      Assessment & Plan:

## 2015-07-19 NOTE — Assessment & Plan Note (Signed)
Smoker and counseled to quit, not exercising much and does not use sunscreen. Reminded him that he needs to use sunscreen or protective clothing to decrease his risk of skin cancer. No suspicious moles on exam today. Checking labs and EKG normal. Colonoscopy recommended at 50. BP normal.

## 2015-09-29 ENCOUNTER — Encounter: Payer: 59 | Admitting: Family Medicine

## 2016-01-04 ENCOUNTER — Ambulatory Visit (INDEPENDENT_AMBULATORY_CARE_PROVIDER_SITE_OTHER): Payer: 59 | Admitting: Internal Medicine

## 2016-01-04 ENCOUNTER — Encounter: Payer: Self-pay | Admitting: Internal Medicine

## 2016-01-04 DIAGNOSIS — R21 Rash and other nonspecific skin eruption: Secondary | ICD-10-CM | POA: Diagnosis not present

## 2016-01-04 MED ORDER — VALACYCLOVIR HCL 1 G PO TABS: 1000.0000 mg | ORAL_TABLET | Freq: Two times a day (BID) | ORAL | 0 refills | Status: DC

## 2016-01-04 MED ORDER — HYDROCORTISONE 2.5 % RE CREA: 1.0000 "application " | TOPICAL_CREAM | Freq: Two times a day (BID) | RECTAL | 0 refills | Status: DC

## 2016-01-04 NOTE — Progress Notes (Signed)
Pre visit review using our clinic review tool, if applicable. No additional management support is needed unless otherwise documented below in the visit note. 

## 2016-01-04 NOTE — Progress Notes (Signed)
   Subjective:    Patient ID: Philip Hart, male    DOB: 1970/09/18, 45 y.o.   MRN: [id number]  HPI The patient is a 45 YO man coming in for rash on his gluteus region. Comes and goes over the last year. Wife with HSV and he has taken her valtrex with resolution of his symptoms. He is having it now and extremely itchy and somewhat painful only if he scratches. He denies hemorrhoids, constipation or straining.  Review of Systems  Constitutional: Negative for activity change, appetite change, fatigue, fever and unexpected weight change.  Respiratory: Negative.   Cardiovascular: Negative.   Gastrointestinal: Negative.   Genitourinary: Negative.   Musculoskeletal: Negative.   Skin: Positive for rash. Negative for color change, pallor and wound.      Objective:   Physical Exam  Constitutional: He appears well-developed and well-nourished.  HENT:  Head: Normocephalic and atraumatic.  Eyes: EOM are normal.  Cardiovascular: Normal rate and regular rhythm.   Pulmonary/Chest: Effort normal and breath sounds normal.  Abdominal: Soft.  Genitourinary:  Genitourinary Comments: Red rash around the anal opening with some possible skin tag  Skin: Skin is warm and dry. Rash noted.   Vitals:   01/04/16 1537  BP: 136/90  Pulse: 64  Resp: 14  Temp: 98.1 F (36.7 C)  TempSrc: Oral  SpO2: 98%  Weight: 147 lb (66.7 kg)  Height: 5\' 3"  (1.6 m)      Assessment & Plan:

## 2016-01-04 NOTE — Patient Instructions (Signed)
We have sent in valtrex for the spot on your rear. Take 1 pill twice a day for 2 weeks. Call us back if it is not better and we will have you take it another 2 weeks.   We have also sent in a cream that you can use on the area to help it heal faster.   One thing you can do to help keep it from getting infected is to do a bath once a week with a capful of bleach in the water to get rid of bacteria.

## 2016-01-06 DIAGNOSIS — R21 Rash and other nonspecific skin eruption: Secondary | ICD-10-CM | POA: Insufficient documentation

## 2016-01-06 NOTE — Assessment & Plan Note (Signed)
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 2 week treatment then prn.

## 2016-01-31 ENCOUNTER — Other Ambulatory Visit: Payer: Self-pay | Admitting: Internal Medicine

## 2016-10-09 DIAGNOSIS — M7711 Lateral epicondylitis, right elbow: Secondary | ICD-10-CM | POA: Diagnosis not present

## 2017-09-02 ENCOUNTER — Other Ambulatory Visit: Payer: Self-pay | Admitting: Internal Medicine

## 2018-11-30 ENCOUNTER — Other Ambulatory Visit: Payer: Self-pay

## 2018-11-30 DIAGNOSIS — Z20822 Contact with and (suspected) exposure to covid-19: Secondary | ICD-10-CM

## 2018-12-02 ENCOUNTER — Telehealth: Payer: Self-pay

## 2018-12-02 LAB — NOVEL CORONAVIRUS, NAA: SARS-CoV-2, NAA: NOT DETECTED

## 2018-12-02 NOTE — Telephone Encounter (Signed)
Patient returned call for [facility] lab results - DOB/Address verified - results given, no further questions.

## 2019-09-14 ENCOUNTER — Ambulatory Visit (INDEPENDENT_AMBULATORY_CARE_PROVIDER_SITE_OTHER): Payer: BC Managed Care – PPO

## 2019-09-14 ENCOUNTER — Other Ambulatory Visit: Payer: Self-pay

## 2019-09-14 ENCOUNTER — Encounter: Payer: Self-pay | Admitting: Family Medicine

## 2019-09-14 ENCOUNTER — Ambulatory Visit: Payer: BC Managed Care – PPO | Admitting: Family Medicine

## 2019-09-14 VITALS — BP 154/92 | HR 71 | Temp 97.5°F | Ht 65.0 in | Wt 151.4 lb

## 2019-09-14 DIAGNOSIS — R079 Chest pain, unspecified: Secondary | ICD-10-CM | POA: Diagnosis not present

## 2019-09-14 DIAGNOSIS — I10 Essential (primary) hypertension: Secondary | ICD-10-CM | POA: Diagnosis not present

## 2019-09-14 DIAGNOSIS — F1090 Alcohol use, unspecified, uncomplicated: Secondary | ICD-10-CM

## 2019-09-14 DIAGNOSIS — Z720 Tobacco use: Secondary | ICD-10-CM

## 2019-09-14 DIAGNOSIS — R5383 Other fatigue: Secondary | ICD-10-CM | POA: Diagnosis not present

## 2019-09-14 DIAGNOSIS — Z0000 Encounter for general adult medical examination without abnormal findings: Secondary | ICD-10-CM

## 2019-09-14 DIAGNOSIS — Z789 Other specified health status: Secondary | ICD-10-CM | POA: Insufficient documentation

## 2019-09-14 DIAGNOSIS — Z1159 Encounter for screening for other viral diseases: Secondary | ICD-10-CM | POA: Diagnosis not present

## 2019-09-14 DIAGNOSIS — F341 Dysthymic disorder: Secondary | ICD-10-CM

## 2019-09-14 DIAGNOSIS — Z7289 Other problems related to lifestyle: Secondary | ICD-10-CM

## 2019-09-14 MED ORDER — LISINOPRIL 20 MG PO TABS: 20.0000 mg | ORAL_TABLET | Freq: Every day | ORAL | 3 refills | Status: DC

## 2019-09-14 NOTE — Patient Instructions (Addendum)
Coping with Quitting Smoking  Quitting smoking is a physical and mental challenge. You will face cravings, withdrawal symptoms, and temptation. Before quitting, work with your health care provider to make a plan that can help you cope. Preparation can help you quit and keep you from giving in. How can I cope with cravings? Cravings usually last for 5-10 minutes. If you get through it, the craving will pass. Consider taking the following actions to help you cope with cravings:  Keep your mouth busy: ? Chew sugar-free gum. ? Suck on hard candies or a straw. ? Brush your teeth.  Keep your hands and body busy: ? Immediately change to a different activity when you feel a craving. ? Squeeze or play with a ball. ? Do an activity or a hobby, like making bead jewelry, practicing needlepoint, or working with wood. ? Mix up your normal routine. ? Take a short exercise break. Go for a quick walk or run up and down stairs. ? Spend time in public places where smoking is not allowed.  Focus on doing something kind or helpful for someone else.  Call a friend or family member to talk during a craving.  Join a support group.  Call a quit line, such as 1-800-QUIT-NOW.  Talk with your health care provider about medicines that might help you cope with cravings and make quitting easier for you. How can I deal with withdrawal symptoms? Your body may experience negative effects as it tries to get used to not having nicotine in the system. These effects are called withdrawal symptoms. They may include:  Feeling hungrier than normal.  Trouble concentrating.  Irritability.  Trouble sleeping.  Feeling depressed.  Restlessness and agitation.  Craving a cigarette. To manage withdrawal symptoms:  Avoid places, people, and activities that trigger your cravings.  Remember why you want to quit.  Get plenty of sleep.  Avoid coffee and other caffeinated drinks. These may worsen some of your  symptoms. How can I handle social situations? Social situations can be difficult when you are quitting smoking, especially in the first few weeks. To manage this, you can:  Avoid parties, bars, and other social situations where people might be smoking.  Avoid alcohol.  Leave right away if you have the urge to smoke.  Explain to your family and friends that you are quitting smoking. Ask for understanding and support.  Plan activities with friends or family where smoking is not an option. What are some ways I can cope with stress? Wanting to smoke may cause stress, and stress can make you want to smoke. Find ways to manage your stress. Relaxation techniques can help. For example:  Breathe slowly and deeply, in through your nose and out through your mouth.  Listen to soothing, relaxing music.  Talk with a family member or friend about your stress.  Light a candle.  Soak in a bath or take a shower.  Think about a peaceful place. What are some ways I can prevent weight gain? Be aware that many people gain weight after they quit smoking. However, not everyone does. To keep from gaining weight, have a plan in place before you quit and stick to the plan after you quit. Your plan should include:  Having healthy snacks. When you have a craving, it may help to: ? Eat plain popcorn, crunchy carrots, celery, or other cut vegetables. ? Chew sugar-free gum.  Changing how you eat: ? Eat small portion sizes at meals. ? Eat 4-6 small meals  throughout the day instead of 1-2 large meals a day. ? Be mindful when you eat. Do not watch television or do other things that might distract you as you eat.  Exercising regularly: ? Make time to exercise each day. If you do not have time for a long workout, do short bouts of exercise for 5-10 minutes several times a day. ? Do some form of strengthening exercise, like weight lifting, and some form of aerobic exercise, like running or swimming.  Drinking  plenty of water or other low-calorie or no-calorie drinks. Drink 6-8 glasses of water daily, or as much as instructed by your health care provider. Summary  Quitting smoking is a physical and mental challenge. You will face cravings, withdrawal symptoms, and temptation to smoke again. Preparation can help you as you go through these challenges.  You can cope with cravings by keeping your mouth busy (such as by chewing gum), keeping your body and hands busy, and making calls to family, friends, or a helpline for people who want to quit smoking.  You can cope with withdrawal symptoms by avoiding places where people smoke, avoiding drinks with caffeine, and getting plenty of rest.  Ask your health care provider about the different ways to prevent weight gain, avoid stress, and handle social situations. This information is not intended to replace advice given to you by your health care provider. Make sure you discuss any questions you have with your health care provider. Document Revised: 02/28/2017 Document Reviewed: 03/15/2016 Elsevier Patient Education  [employer].  Fatigue If you have fatigue, you feel tired all the time and have a lack of energy or a lack of motivation. Fatigue may make it difficult to start or complete tasks because of exhaustion. In general, occasional or mild fatigue is often a normal response to activity or life. However, long-lasting (chronic) or extreme fatigue may be a symptom of a medical condition. Follow these instructions at home: General instructions  Watch your fatigue for any changes.  Go to bed and get up at the same time every day.  Avoid fatigue by pacing yourself during the day and getting enough sleep at night.  Maintain a healthy weight. Medicines  Take over-the-counter and prescription medicines only as told by your health care provider.  Take a multivitamin, if told by your health care provider.  Do not use herbal or dietary supplements  unless they are approved by your health care provider. Activity   Exercise regularly, as told by your health care provider.  Use or practice techniques to help you relax, such as yoga, tai chi, meditation, or massage therapy. Eating and drinking   Avoid heavy meals in the evening.  Eat a well-balanced diet, which includes lean proteins, whole grains, plenty of fruits and vegetables, and low-fat dairy products.  Avoid consuming too much caffeine.  Avoid the use of alcohol.  Drink enough fluid to keep your urine pale yellow. Lifestyle  Change situations that cause you stress. Try to keep your work and personal schedule in balance.  Do not use any products that contain nicotine or tobacco, such as cigarettes and e-cigarettes. If you need help quitting, ask your health care provider.  Do not use drugs. Contact a health care provider if:  Your fatigue does not get better.  You have a fever.  You suddenly lose or gain weight.  You have headaches.  You have trouble falling asleep or sleeping through the night.  You feel angry, guilty, anxious, or sad.  You are unable to have a bowel movement (constipation).  Your skin is dry.  You have swelling in your legs or another part of your body. Get help right away if:  You feel confused.  Your vision is blurry.  You feel faint or you pass out.  You have a severe headache.  You have severe pain in your abdomen, your back, or the area between your waist and hips (pelvis).  You have chest pain, shortness of breath, or an irregular or fast heartbeat.  You are unable to urinate, or you urinate less than normal.  You have abnormal bleeding, such as bleeding from the rectum, vagina, nose, lungs, or nipples.  You vomit blood.  You have thoughts about hurting yourself or others. If you ever feel like you may hurt yourself or others, or have thoughts about taking your own life, get help right away. You can go to your nearest  emergency department or call:  Your local emergency services (911 in the [place]  A suicide crisis helpline, such as the [place] at (561) 430-8396. This is open 24 hours a day. Summary  If you have fatigue, you feel tired all the time and have a lack of energy or a lack of motivation.  Fatigue may make it difficult to start or complete tasks because of exhaustion.  Long-lasting (chronic) or extreme fatigue may be a symptom of a medical condition.  Exercise regularly, as told by your health care provider.  Change situations that cause you stress. Try to keep your work and personal schedule in balance. This information is not intended to replace advice given to you by your health care provider. Make sure you discuss any questions you have with your health care provider. Document Revised: 10/07/2018 Document Reviewed: 12/11/2016 Elsevier Patient Education  2020 [employer] Maintenance, Male Adopting a healthy lifestyle and getting preventive care are important in promoting health and wellness. Ask your health care provider about:  The right schedule for you to have regular tests and exams.  Things you can do on your own to prevent diseases and keep yourself healthy. What should I know about diet, weight, and exercise? Eat a healthy diet   Eat a diet that includes plenty of vegetables, fruits, low-fat dairy products, and lean protein.  Do not eat a lot of foods that are high in solid fats, added sugars, or sodium. Maintain a healthy weight Body mass index (BMI) is a measurement that can be used to identify possible weight problems. It estimates body fat based on height and weight. Your health care provider can help determine your BMI and help you achieve or maintain a healthy weight. Get regular exercise Get regular exercise. This is one of the most important things you can do for your health. Most adults should:  Exercise for at least 150  minutes each week. The exercise should increase your heart rate and make you sweat (moderate-intensity exercise).  Do strengthening exercises at least twice a week. This is in addition to the moderate-intensity exercise.  Spend less time sitting. Even light physical activity can be beneficial. Watch cholesterol and blood lipids Have your blood tested for lipids and cholesterol at 48 years of age, then have this test every 5 years. You may need to have your cholesterol levels checked more often if:  Your lipid or cholesterol levels are high.  You are older than 48 years of age.  You are at high risk for heart disease. What should  I know about cancer screening? Many types of cancers can be detected early and may often be prevented. Depending on your health history and family history, you may need to have cancer screening at various ages. This may include screening for:  Colorectal cancer.  Prostate cancer.  Skin cancer.  Lung cancer. What should I know about heart disease, diabetes, and high blood pressure? Blood pressure and heart disease  High blood pressure causes heart disease and increases the risk of stroke. This is more likely to develop in people who have high blood pressure readings, are of African descent, or are overweight.  Talk with your health care provider about your target blood pressure readings.  Have your blood pressure checked: ? Every 3-5 years if you are 62-19 years of age. ? Every year if you are 25 years old or older.  If you are between the ages of 20 and 63 and are a current or former smoker, ask your health care provider if you should have a one-time screening for abdominal aortic aneurysm (AAA). Diabetes Have regular diabetes screenings. This checks your fasting blood sugar level. Have the screening done:  Once every three years after age 54 if you are at a normal weight and have a low risk for diabetes.  More often and at a younger age if you are  overweight or have a high risk for diabetes. What should I know about preventing infection? Hepatitis B If you have a higher risk for hepatitis B, you should be screened for this virus. Talk with your health care provider to find out if you are at risk for hepatitis B infection. Hepatitis C Blood testing is recommended for:  Everyone born from [birth year] through 1964.  Anyone with known risk factors for hepatitis C. Sexually transmitted infections (STIs)  You should be screened each year for STIs, including gonorrhea and chlamydia, if: ? You are sexually active and are younger than 48 years of age. ? You are older than 48 years of age and your health care provider tells you that you are at risk for this type of infection. ? Your sexual activity has changed since you were last screened, and you are at increased risk for chlamydia or gonorrhea. Ask your health care provider if you are at risk.  Ask your health care provider about whether you are at high risk for HIV. Your health care provider may recommend a prescription medicine to help prevent HIV infection. If you choose to take medicine to prevent HIV, you should first get tested for HIV. You should then be tested every 3 months for as long as you are taking the medicine. Follow these instructions at home: Lifestyle  Do not use any products that contain nicotine or tobacco, such as cigarettes, e-cigarettes, and chewing tobacco. If you need help quitting, ask your health care provider.  Do not use street drugs.  Do not share needles.  Ask your health care provider for help if you need support or information about quitting drugs. Alcohol use  Do not drink alcohol if your health care provider tells you not to drink.  If you drink alcohol: ? Limit how much you have to 0-2 drinks a day. ? Be aware of how much alcohol is in your drink. In the [place] one drink equals one 12 oz bottle of beer (355 mL), one 5 oz glass of wine (148 mL), or one 1  oz glass of hard liquor (44 mL). General instructions  Schedule regular health, dental,  and eye exams.  Stay current with your vaccines.  Tell your health care provider if: ? You often feel depressed. ? You have ever been abused or do not feel safe at home. Summary  Adopting a healthy lifestyle and getting preventive care are important in promoting health and wellness.  Follow your health care provider's instructions about healthy diet, exercising, and getting tested or screened for diseases.  Follow your health care provider's instructions on monitoring your cholesterol and blood pressure. This information is not intended to replace advice given to you by your health care provider. Make sure you discuss any questions you have with your health care provider. Document Revised: 03/11/2018 Document Reviewed: 03/11/2018 Elsevier Patient Education  [employer].  Managing Your Hypertension Hypertension is commonly called high blood pressure. This is when the force of your blood pressing against the walls of your arteries is too strong. Arteries are blood vessels that carry blood from your heart throughout your body. Hypertension forces the heart to work harder to pump blood, and may cause the arteries to become narrow or stiff. Having untreated or uncontrolled hypertension can cause heart attack, stroke, kidney disease, and other problems. What are blood pressure readings? A blood pressure reading consists of a higher number over a lower number. Ideally, your blood pressure should be below 120/80. The first ("top") number is called the systolic pressure. It is a measure of the pressure in your arteries as your heart beats. The second ("bottom") number is called the diastolic pressure. It is a measure of the pressure in your arteries as the heart relaxes. What does my blood pressure reading mean? Blood pressure is classified into four stages. Based on your blood pressure reading, your  health care provider may use the following stages to determine what type of treatment you need, if any. Systolic pressure and diastolic pressure are measured in a unit called mm Hg. Normal  Systolic pressure: below 993.  Diastolic pressure: below 80. Elevated  Systolic pressure: 716-967.  Diastolic pressure: below 80. Hypertension stage 1  Systolic pressure: 893-810.  Diastolic pressure: 17-51. Hypertension stage 2  Systolic pressure: 025 or above.  Diastolic pressure: 90 or above. What health risks are associated with hypertension? Managing your hypertension is an important responsibility. Uncontrolled hypertension can lead to:  A heart attack.  A stroke.  A weakened blood vessel (aneurysm).  Heart failure.  Kidney damage.  Eye damage.  Metabolic syndrome.  Memory and concentration problems. What changes can I make to manage my hypertension? Hypertension can be managed by making lifestyle changes and possibly by taking medicines. Your health care provider will help you make a plan to bring your blood pressure within a normal range. Eating and drinking   Eat a diet that is high in fiber and potassium, and low in salt (sodium), added sugar, and fat. An example eating plan is called the DASH (Dietary Approaches to Stop Hypertension) diet. To eat this way: ? Eat plenty of fresh fruits and vegetables. Try to fill half of your plate at each meal with fruits and vegetables. ? Eat whole grains, such as whole wheat pasta, brown rice, or whole grain bread. Fill about one quarter of your plate with whole grains. ? Eat low-fat diary products. ? Avoid fatty cuts of meat, processed or cured meats, and poultry with skin. Fill about one quarter of your plate with lean proteins such as fish, chicken without skin, beans, eggs, and tofu. ? Avoid premade and processed foods. These tend  to be higher in sodium, added sugar, and fat.  Reduce your daily sodium intake. Most people with  hypertension should eat less than 1,500 mg of sodium a day.  Limit alcohol intake to no more than 1 drink a day for nonpregnant women and 2 drinks a day for men. One drink equals 12 oz of beer, 5 oz of wine, or 1 oz of hard liquor. Lifestyle  Work with your health care provider to maintain a healthy body weight, or to lose weight. Ask what an ideal weight is for you.  Get at least 30 minutes of exercise that causes your heart to beat faster (aerobic exercise) most days of the week. Activities may include walking, swimming, or biking.  Include exercise to strengthen your muscles (resistance exercise), such as weight lifting, as part of your weekly exercise routine. Try to do these types of exercises for 30 minutes at least 3 days a week.  Do not use any products that contain nicotine or tobacco, such as cigarettes and e-cigarettes. If you need help quitting, ask your health care provider.  Control any long-term (chronic) conditions you have, such as high cholesterol or diabetes. Monitoring  Monitor your blood pressure at home as told by your health care provider. Your personal target blood pressure may vary depending on your medical conditions, your age, and other factors.  Have your blood pressure checked regularly, as often as told by your health care provider. Working with your health care provider  Review all the medicines you take with your health care provider because there may be side effects or interactions.  Talk with your health care provider about your diet, exercise habits, and other lifestyle factors that may be contributing to hypertension.  Visit your health care provider regularly. Your health care provider can help you create and adjust your plan for managing hypertension. Will I need medicine to control my blood pressure? Your health care provider may prescribe medicine if lifestyle changes are not enough to get your blood pressure under control, and if:  Your systolic  blood pressure is 130 or higher.  Your diastolic blood pressure is 80 or higher. Take medicines only as told by your health care provider. Follow the directions carefully. Blood pressure medicines must be taken as prescribed. The medicine does not work as well when you skip doses. Skipping doses also puts you at risk for problems. Contact a health care provider if:  You think you are having a reaction to medicines you have taken.  You have repeated (recurrent) headaches.  You feel dizzy.  You have swelling in your ankles.  You have trouble with your vision. Get help right away if:  You develop a severe headache or confusion.  You have unusual weakness or numbness, or you feel faint.  You have severe pain in your chest or abdomen.  You vomit repeatedly.  You have trouble breathing. Summary  Hypertension is when the force of blood pumping through your arteries is too strong. If this condition is not controlled, it may put you at risk for serious complications.  Your personal target blood pressure may vary depending on your medical conditions, your age, and other factors. For most people, a normal blood pressure is less than 120/80.  Hypertension is managed by lifestyle changes, medicines, or both. Lifestyle changes include weight loss, eating a healthy, low-sodium diet, exercising more, and limiting alcohol. This information is not intended to replace advice given to you by your health care provider. Make sure you  discuss any questions you have with your health care provider. Document Revised: 07/10/2018 Document Reviewed: 02/14/2016 Elsevier Patient Education  2020 [employer]  Preventive Care 52-23 Years Old, Male Preventive care refers to lifestyle choices and visits with your health care provider that can promote health and wellness. This includes:  A yearly physical exam. This is also called an annual well check.  Regular dental and eye  exams.  Immunizations.  Screening for certain conditions.  Healthy lifestyle choices, such as eating a healthy diet, getting regular exercise, not using drugs or products that contain nicotine and tobacco, and limiting alcohol use. What can I expect for my preventive care visit? Physical exam Your health care provider will check:  Height and weight. These may be used to calculate body mass index (BMI), which is a measurement that tells if you are at a healthy weight.  Heart rate and blood pressure.  Your skin for abnormal spots. Counseling Your health care provider may ask you questions about:  Alcohol, tobacco, and drug use.  Emotional well-being.  Home and relationship well-being.  Sexual activity.  Eating habits.  Work and work Statistician. What immunizations do I need?  Influenza (flu) vaccine  This is recommended every year. Tetanus, diphtheria, and pertussis (Tdap) vaccine  You may need a Td booster every 10 years. Varicella (chickenpox) vaccine  You may need this vaccine if you have not already been vaccinated. Zoster (shingles) vaccine  You may need this after age 30. Measles, mumps, and rubella (MMR) vaccine  You may need at least one dose of MMR if you were born in 1957 or later. You may also need a second dose. Pneumococcal conjugate (PCV13) vaccine  You may need this if you have certain conditions and were not previously vaccinated. Pneumococcal polysaccharide (PPSV23) vaccine  You may need one or two doses if you smoke cigarettes or if you have certain conditions. Meningococcal conjugate (MenACWY) vaccine  You may need this if you have certain conditions. Hepatitis A vaccine  You may need this if you have certain conditions or if you travel or work in places where you may be exposed to hepatitis A. Hepatitis B vaccine  You may need this if you have certain conditions or if you travel or work in places where you may be exposed to hepatitis  B. Haemophilus influenzae type b (Hib) vaccine  You may need this if you have certain risk factors. Human papillomavirus (HPV) vaccine  If recommended by your health care provider, you may need three doses over 6 months. You may receive vaccines as individual doses or as more than one vaccine together in one shot (combination vaccines). Talk with your health care provider about the risks and benefits of combination vaccines. What tests do I need? Blood tests  Lipid and cholesterol levels. These may be checked every 5 years, or more frequently if you are over 62 years old.  Hepatitis C test.  Hepatitis B test. Screening  Lung cancer screening. You may have this screening every year starting at age 78 if you have a 30-pack-year history of smoking and currently smoke or have quit within the past 15 years.  Prostate cancer screening. Recommendations will vary depending on your family history and other risks.  Colorectal cancer screening. All adults should have this screening starting at age 6 and continuing until age 50. Your health care provider may recommend screening at age 101 if you are at increased risk. You will have tests every 1-10 years, depending on  your results and the type of screening test.  Diabetes screening. This is done by checking your blood sugar (glucose) after you have not eaten for a while (fasting). You may have this done every 1-3 years.  Sexually transmitted disease (STD) testing. Follow these instructions at home: Eating and drinking  Eat a diet that includes fresh fruits and vegetables, whole grains, lean protein, and low-fat dairy products.  Take vitamin and mineral supplements as recommended by your health care provider.  Do not drink alcohol if your health care provider tells you not to drink.  If you drink alcohol: ? Limit how much you have to 0-2 drinks a day. ? Be aware of how much alcohol is in your drink. In the [place] one drink equals one 12 oz  bottle of beer (355 mL), one 5 oz glass of wine (148 mL), or one 1 oz glass of hard liquor (44 mL). Lifestyle  Take daily care of your teeth and gums.  Stay active. Exercise for at least 30 minutes on 5 or more days each week.  Do not use any products that contain nicotine or tobacco, such as cigarettes, e-cigarettes, and chewing tobacco. If you need help quitting, ask your health care provider.  If you are sexually active, practice safe sex. Use a condom or other form of protection to prevent STIs (sexually transmitted infections).  Talk with your health care provider about taking a low-dose aspirin every day starting at age 40. What's next?  Go to your health care provider once a year for a well check visit.  Ask your health care provider how often you should have your eyes and teeth checked.  Stay up to date on all vaccines. This information is not intended to replace advice given to you by your health care provider. Make sure you discuss any questions you have with your health care provider. Document Revised: 03/12/2018 Document Reviewed: 03/12/2018 Elsevier Patient Education  Philip Hart.  Alcohol Abuse and Dependence Information, Adult Alcohol is a widely available drug. People drink alcohol in different amounts. People who drink alcohol very often and in large amounts often have problems during and after drinking. They may develop what is called an alcohol use disorder. There are two main types of alcohol use disorders:  Alcohol abuse. This is when you use alcohol too much or too often. You may use alcohol to make yourself feel happy or to reduce stress. You may have a hard time setting a limit on the amount you drink.  Alcohol dependence. This is when you use alcohol consistently for a period of time, and your body changes as a result. This can make it hard to stop drinking because you may start to feel sick or feel different when you do not use alcohol. These symptoms are  known as withdrawal. How can alcohol abuse and dependence affect me? Alcohol abuse and dependence can have a negative effect on your life. Drinking too much can lead to addiction. You may feel like you need alcohol to function normally. You may drink alcohol before work in the morning, during the day, or as soon as you get home from work in the evening. These actions can result in:  Poor work performance.  Job loss.  Financial problems.  Car crashes or criminal charges from driving after drinking alcohol.  Problems in your relationships with friends and family.  Losing the trust and respect of coworkers, friends, and family. Drinking heavily over a long period of time can  permanently damage your body and brain, and can cause lifelong health issues, such as:  Damage to your liver or pancreas.  Heart problems, high blood pressure, or stroke.  Certain cancers.  Decreased ability to fight infections.  Brain or nerve damage.  Depression.  Early (premature) death. If you are careless or you crave alcohol, it is easy to drink more than your body can handle (overdose). Alcohol overdose is a serious situation that requires hospitalization. It may lead to permanent injuries or death. What can increase my risk?  Having a family history of alcohol abuse.  Having depression or other mental health conditions.  Beginning to drink at an early age.  Binge drinking often.  Experiencing trauma, stress, and an unstable home life during childhood.  Spending time with people who drink often. What actions can I take to prevent or manage alcohol abuse and dependence?  Do not drink alcohol if: ? Your health care provider tells you not to drink. ? You are pregnant, may be pregnant, or are planning to become pregnant.  If you drink alcohol: ? Limit how much you use to:  0-1 drink a day for women.  0-2 drinks a day for men. ? Be aware of how much alcohol is in your drink. In the [place] one  drink equals one 12 oz bottle of beer (355 mL), one 5 oz glass of wine (148 mL), or one 1 oz glass of hard liquor (44 mL).  Stop drinking if you have been drinking too much. This can be very hard to do if you are used to abusing alcohol. If you begin to have withdrawal symptoms, talk with your health care provider or a person that you trust. These symptoms may include anxiety, shaky hands, headache, nausea, sweating, or not being able to sleep.  Choose to drink nonalcoholic beverages in social gatherings and places where there may be alcohol. Activity  Spend more time on activities that you enjoy that do not involve alcohol, like hobbies or exercise.  Find healthy ways to cope with stress, such as exercise, meditation, or spending time with people you care about. General information  Talk to your family, coworkers, and friends about supporting you in your efforts to stop drinking. If they drink, ask them not to drink around you. Spend more time with people who do not drink alcohol.  If you think that you have an alcohol dependency problem: ? Tell friends or family about your concerns. ? Talk with your health care provider or another health professional about where to get help. ? Work with a Transport planner and a Regulatory affairs officer. ? Consider joining a support group for people who struggle with alcohol abuse and dependence. Where to find support   Your health care provider.  SMART Recovery: www.smartrecovery.org Therapy and support groups  Local treatment centers or chemical dependency counselors.  Local AA groups in your community: NicTax.com.pt Where to find more information  Centers for Disease Control and Prevention: http://www.wolf.info/  National Institute on Alcohol Abuse and Alcoholism: http://www.bradshaw.com/  Alcoholics Anonymous (AA): NicTax.com.pt Contact a health care provider if:  You drank more or for longer than you intended on more than one occasion.  You tried to stop  drinking or to cut back on how much you drink, but you were not able to.  You often drink to the point of vomiting or passing out.  You want to drink so badly that you cannot think about anything else.  You have problems in your life  due to drinking, but you continue to drink.  You keep drinking even though you feel anxious, depressed, or have experienced memory loss.  You have stopped doing the things you used to enjoy in order to drink.  You have to drink more than you used to in order to get the effect you want.  You experience anxiety, sweating, nausea, shakiness, and trouble sleeping when you try to stop drinking. Get help right away if:  You have thoughts about hurting yourself or others.  You have serious withdrawal symptoms, including: ? Confusion. ? Racing heart. ? High blood pressure. ? Fever. If you ever feel like you may hurt yourself or others, or have thoughts about taking your own life, get help right away. You can go to your nearest emergency department or call:  Your local emergency services (911 in the [place]  A suicide crisis helpline, such as the [facility] at 936 452 3118. This is open 24 hours a day. Summary  Alcohol abuse and dependence can have a negative effect on your life. Drinking too much or too often can lead to addiction.  If you drink alcohol, limit how much you use.  If you are having trouble keeping your drinking under control, find ways to change your behavior. Hobbies, calming activities, exercise, or support groups can help.  If you feel you need help with changing your drinking habits, talk with your health care provider, a good friend, or a therapist, or go to an [place] group. This information is not intended to replace advice given to you by your health care provider. Make sure you discuss any questions you have with your health care provider. Document Revised: 07/07/2018 Document Reviewed: 05/26/2018 Elsevier  Patient Education  Philip Hart.

## 2019-09-14 NOTE — Progress Notes (Signed)
New Patient Office Visit  Subjective:  Patient ID: Philip Hart, male    DOB: May 18, 1970  Age: 49 y.o. MRN: [id number]  CC:  Chief Complaint  Patient presents with  . Establish Care    New patient, elevated BP c/o fatigue feeling a lot.     HPI Philip Hart presents for evaluation of an ongoing sense of fatigue and loss of pleasure.  Denies frank depression or anxiety.  Sleeping particularly well.  Just feels tired and has little energy.  Tells me that he never went on a hunting during last years deer season.  Works as a Production designer, theatre/television/film and lives with his wife and 34 year old son.  Somewhat concerned about his son who has not seem to have found himself yet.  Patient smokes a half a pack to a pack of cigarettes daily.  He drinks 3-4 beers daily.  Denies shortness of breath nausea vomiting or diaphoresis.  Chest pain does not seem to have an exertional component.  LDL cholesterol in 2017 was 121 with an HDL of 70.  Emergency room visit in 2019 with suicidal ideation.  He had been fighting with his wife.  Alcohol was involved.  Patient tells me that his wife is not been concerned about his drinking.  She drinks with them.  Their relationship is good.  After 15 years they continue to enjoy an active sex life.  She is concerned about his blood pressure.  At home it has been running in the 160s over 90s.  Past Medical History:  Diagnosis Date  . Chicken pox     Past Surgical History:  Procedure Laterality Date  . VASECTOMY      Family History  Problem Relation Age of Onset  . Diabetes Mother   . Hypertension Mother   . Heart disease Father   . Hypertension Father   . Alcohol abuse Maternal Grandfather   . Hypertension Brother   . Stroke Brother     Social History   Socioeconomic History  . Marital status: Married    Spouse name: Not on file  . Number of children: Not on file  . Years of education: Not on file  . Highest education level: Not on file  Occupational History    . Not on file  Tobacco Use  . Smoking status: Current Every Day Smoker    Packs/day: 1.00    Years: 20.00    Pack years: 20.00    Types: Cigarettes  . Smokeless tobacco: Never Used  Vaping Use  . Vaping Use: Some days  Substance and Sexual Activity  . Alcohol use: Yes    Alcohol/week: 4.0 standard drinks    Types: 4 Cans of beer per week    Comment: beer-3-4 per daily  . Drug use: No  . Sexual activity: Yes  Other Topics Concern  . Not on file  Social History Narrative  . Not on file   Social Determinants of Health   Financial Resource Strain:   . Difficulty of Paying Living Expenses:   Food Insecurity:   . Worried About Programme researcher, broadcasting/film/video in the Last Year:   . Barista in the Last Year:   Transportation Needs:   . Freight forwarder (Medical):   Marland Kitchen Lack of Transportation (Non-Medical):   Physical Activity:   . Days of Exercise per Week:   . Minutes of Exercise per Session:   Stress:   . Feeling of Stress :  Social Connections:   . Frequency of Communication with Friends and Family:   . Frequency of Social Gatherings with Friends and Family:   . Attends Religious Services:   . Active Member of Clubs or Organizations:   . Attends Banker Meetings:   Marland Kitchen Marital Status:   Intimate Partner Violence:   . Fear of Current or Ex-Partner:   . Emotionally Abused:   Marland Kitchen Physically Abused:   . Sexually Abused:     ROS Review of Systems  Constitutional: Negative for diaphoresis, fatigue, fever and unexpected weight change.  HENT: Negative.   Eyes: Negative for photophobia and visual disturbance.  Respiratory: Negative for chest tightness and shortness of breath.   Cardiovascular: Positive for chest pain. Negative for palpitations and leg swelling.  Gastrointestinal: Negative for abdominal pain, anal bleeding, blood in stool, nausea and vomiting.  Endocrine: Negative for polyphagia and polyuria.  Genitourinary: Negative.  Negative for difficulty  urinating, hematuria and urgency.  Musculoskeletal: Negative for gait problem and joint swelling.  Skin: Negative for pallor and rash.  Allergic/Immunologic: Negative for immunocompromised state.  Neurological: Negative for speech difficulty, weakness, numbness and headaches.  Hematological: Does not bruise/bleed easily.   Depression screen [facility] 2/9 09/14/2019 09/27/2014 10/15/2012  Decreased Interest 2 0 0  Down, Depressed, Hopeless 0 0 0  PHQ - 2 Score 2 0 0  Altered sleeping 2 - -  Tired, decreased energy 3 - -  Change in appetite 2 - -  Feeling bad or failure about yourself  0 - -  Trouble concentrating 0 - -  Moving slowly or fidgety/restless 0 - -  Suicidal thoughts 0 - -  PHQ-9 Score 9 - -  Difficult doing work/chores Not difficult at all - -    Objective:   Today's Vitals: BP (!) 154/92   Pulse 71   Temp (!) 97.5 F (36.4 C) (Tympanic)   Ht 5\' 5"  (1.651 m)   Wt 151 lb 6.4 oz (68.7 kg)   SpO2 97%   BMI 25.19 kg/m   Physical Exam Vitals and nursing note reviewed.  Constitutional:      General: He is not in acute distress.    Appearance: Normal appearance. He is normal weight. He is not ill-appearing, toxic-appearing or diaphoretic.  HENT:     Head: Normocephalic and atraumatic.     Right Ear: External ear normal.     Left Ear: External ear normal.     Nose: No congestion.     Mouth/Throat:     Pharynx: No oropharyngeal exudate.  Eyes:     General: No scleral icterus.       Right eye: No discharge.        Left eye: No discharge.     Extraocular Movements: Extraocular movements intact.  Cardiovascular:     Rate and Rhythm: Normal rate and regular rhythm.  Pulmonary:     Effort: Pulmonary effort is normal.     Breath sounds: Normal breath sounds.  Abdominal:     General: Bowel sounds are normal.  Musculoskeletal:     Cervical back: Neck supple.     Right lower leg: No edema.     Left lower leg: No edema.  Skin:    General: Skin is warm and dry.      Coloration: Skin is not jaundiced.  Neurological:     General: No focal deficit present.     Mental Status: He is alert and oriented to person, place, and time.  Psychiatric:        Mood and Affect: Mood normal.        Behavior: Behavior normal.     Assessment & Plan:   Problem List Items Addressed This Visit      Cardiovascular and Mediastinum   Essential hypertension - Primary   Relevant Medications   lisinopril (ZESTRIL) 20 MG tablet   Other Relevant Orders   CBC   Comprehensive metabolic panel   DG Chest 2 View     Other   Tobacco use   Relevant Orders   DG Chest 2 View   Chest pain   Relevant Orders   EKG 12-Lead (Completed)   LDL cholesterol, direct   DG Chest 2 View   Dysthymia   Alcohol use   Fatigue   Relevant Orders   Hemoglobin A1c   TSH   VITAMIN D 25 Hydroxy (Vit-D Deficiency, Fractures)    Other Visit Diagnoses    Healthcare maintenance       Relevant Orders   LDL cholesterol, direct   Urinalysis, Routine w reflex microscopic   HIV Antibody (routine testing w rflx)   Hepatitis C antibody      Outpatient Encounter Medications as of 09/14/2019  Medication Sig  . lisinopril (ZESTRIL) 20 MG tablet Take 1 tablet (20 mg total) by mouth daily.  Marland Kitchen PROCTOSOL HC 2.5 % rectal cream PLACE 1 APPLICATION RECTALLY 2 (TWO) TIMES DAILY. (Patient not taking: Reported on 09/14/2019)  . triamcinolone ointment (KENALOG) 0.1 % Apply 1 application topically 2 (two) times daily. (Patient not taking: Reported on 01/04/2016)  . valACYclovir (VALTREX) 1000 MG tablet TAKE 1 TABLET BY MOUTH TWICE A DAY (Patient not taking: Reported on 09/14/2019)   No facility-administered encounter medications on file as of 09/14/2019.    Follow-up: Return in about 1 month (around 10/14/2019).   Patient was given information on health maintenance and disease prevention.  Also given information on coping with quitting smoking as well as alcohol abuse and dependence information.  Finally he  was given information on fatigue and managing hypertension.  We discussed that smoking and alcohol consumption added to hypertension.  Also discussed that he and his wife should consider moderating her alcohol consumption to no more than 2 servings daily.  Patient declined treatment for depression at this time.  Agrees to try lisinopril.  Libby Maw, MD

## 2019-09-15 ENCOUNTER — Other Ambulatory Visit (INDEPENDENT_AMBULATORY_CARE_PROVIDER_SITE_OTHER): Payer: BC Managed Care – PPO

## 2019-09-15 DIAGNOSIS — Z0000 Encounter for general adult medical examination without abnormal findings: Secondary | ICD-10-CM | POA: Diagnosis not present

## 2019-09-15 LAB — COMPREHENSIVE METABOLIC PANEL
ALT: 27 U/L (ref 0–53)
AST: 18 U/L (ref 0–37)
Albumin: 4.6 g/dL (ref 3.5–5.2)
Alkaline Phosphatase: 45 U/L (ref 39–117)
BUN: 12 mg/dL (ref 6–23)
CO2: 24 mEq/L (ref 19–32)
Calcium: 9.7 mg/dL (ref 8.4–10.5)
Chloride: 106 mEq/L (ref 96–112)
Creatinine, Ser: 1.03 mg/dL (ref 0.40–1.50)
GFR: 77.06 mL/min (ref >60.00)
Glucose, Bld: 84 mg/dL (ref 70–99)
Potassium: 4.5 mEq/L (ref 3.5–5.1)
Sodium: 139 mEq/L (ref 135–145)
Total Bilirubin: 0.7 mg/dL (ref 0.2–1.2)
Total Protein: 7.6 g/dL (ref 6.0–8.3)

## 2019-09-15 LAB — URINALYSIS
Hgb urine dipstick: NEGATIVE
Leukocytes,Ua: NEGATIVE
Nitrite: NEGATIVE
Specific Gravity, Urine: 1.025 (ref 1.000–1.030)
Total Protein, Urine: NEGATIVE
Urine Glucose: NEGATIVE
Urobilinogen, UA: 0.2 (ref 0.0–1.0)
pH: 5.5 (ref 5.0–8.0)

## 2019-09-15 LAB — CBC
HCT: 50.6 % (ref 39.0–52.0)
Hemoglobin: 17.5 g/dL — ABNORMAL HIGH (ref 13.0–17.0)
MCHC: 34.6 g/dL (ref 30.0–36.0)
MCV: 99.8 fl (ref 78.0–100.0)
Platelets: 113 10*3/uL — ABNORMAL LOW (ref 150.0–400.0)
RBC: 5.07 Mil/uL (ref 4.22–5.81)
RDW: 13.6 % (ref 11.5–15.5)
WBC: 8.5 10*3/uL (ref 4.0–10.5)

## 2019-09-15 LAB — VITAMIN D 25 HYDROXY (VIT D DEFICIENCY, FRACTURES): VITD: 40.55 ng/mL (ref 30.00–100.00)

## 2019-09-15 LAB — HIV ANTIBODY (ROUTINE TESTING W REFLEX): HIV 1&2 Ab, 4th Generation: NONREACTIVE

## 2019-09-15 LAB — HEPATITIS C ANTIBODY
Hepatitis C Ab: NONREACTIVE
SIGNAL TO CUT-OFF: 0.02 (ref <1.00)

## 2019-09-15 LAB — TSH: TSH: 3.18 u[IU]/mL (ref 0.35–4.50)

## 2019-09-15 LAB — HEMOGLOBIN A1C: Hgb A1c MFr Bld: 5.9 % (ref 4.6–6.5)

## 2019-09-15 LAB — LDL CHOLESTEROL, DIRECT: Direct LDL: 145 mg/dL

## 2019-09-18 ENCOUNTER — Encounter: Payer: Self-pay | Admitting: Family Medicine

## 2019-09-21 NOTE — Telephone Encounter (Signed)
Your platelets have been low but stable over the last few years. I think that if you were to stop drinking, this would help. Alcohol is a common cause of lowered platelets.

## 2019-10-15 ENCOUNTER — Other Ambulatory Visit: Payer: Self-pay

## 2019-10-15 ENCOUNTER — Ambulatory Visit: Payer: BC Managed Care – PPO | Admitting: Family Medicine

## 2019-10-15 ENCOUNTER — Encounter: Payer: Self-pay | Admitting: Family Medicine

## 2019-10-15 VITALS — BP 128/74 | HR 65 | Temp 97.5°F | Ht 65.0 in | Wt 150.4 lb

## 2019-10-15 DIAGNOSIS — E7800 Pure hypercholesterolemia, unspecified: Secondary | ICD-10-CM | POA: Diagnosis not present

## 2019-10-15 DIAGNOSIS — I10 Essential (primary) hypertension: Secondary | ICD-10-CM | POA: Diagnosis not present

## 2019-10-15 DIAGNOSIS — Z720 Tobacco use: Secondary | ICD-10-CM | POA: Diagnosis not present

## 2019-10-15 MED ORDER — LISINOPRIL 20 MG PO TABS: 20.0000 mg | ORAL_TABLET | Freq: Every day | ORAL | 3 refills | Status: DC

## 2019-10-15 NOTE — Patient Instructions (Signed)
Preventing High Cholesterol °Cholesterol is a white, waxy substance similar to fat that the human body needs to help build cells. The liver makes all the cholesterol that a person's body needs. Having high cholesterol (hypercholesterolemia) increases a person's risk for heart disease and stroke. Extra (excess) cholesterol comes from the food the person eats. °High cholesterol can often be prevented with diet and lifestyle changes. If you already have high cholesterol, you can control it with diet and lifestyle changes and with medicine. °How can high cholesterol affect me? °If you have high cholesterol, deposits (plaques) may build up on the walls of your arteries. The arteries are the blood vessels that carry blood away from your heart. °Plaques make the arteries narrower and stiffer. This can limit or block blood flow and cause blood clots to form. Blood clots: °· Are tiny balls of cells that form in your blood. °· Can move to the heart or brain, causing a heart attack or stroke. °Plaques in arteries greatly increase your risk for heart attack and stroke.Making diet and lifestyle changes can reduce your risk for these conditions that may threaten your life. °What can increase my risk? °This condition is more likely to develop in people who: °· Eat foods that are high in saturated fat or cholesterol. Saturated fat is mostly found in: °? Foods that contain animal fat, such as red meat and some dairy products. °? Certain fatty foods made from plants, such as tropical oils. °· Are overweight. °· Are not getting enough exercise. °· Have a family history of high cholesterol. °What actions can I take to prevent this? °Nutrition ° °· Eat less saturated fat. °· Avoid trans fats (partially hydrogenated oils). These are often found in margarine and in some baked goods, fried foods, and snacks bought in packages. °· Avoid precooked or cured meat, such as sausages or meat loaves. °· Avoid foods and drinks that have added  sugars. °· Eat more fruits, vegetables, and whole grains. °· Choose healthy sources of protein, such as fish, poultry, lean cuts of red meat, beans, peas, lentils, and nuts. °· Choose healthy sources of fat, such as: °? Nuts. °? Vegetable oils, especially olive oil. °? Fish that have healthy fats (omega-3 fatty acids), such as mackerel or salmon. °The items listed above may not be a complete list of recommended foods and beverages. Contact a dietitian for more information. °Lifestyle °· Lose weight if you are overweight. Losing 5-10 lb (2.3-4.5 kg) can help prevent or control high cholesterol. It can also lower your risk for diabetes and high blood pressure. Ask your health care provider to help you with a diet and exercise plan to lose weight safely. °· Do not use any products that contain nicotine or tobacco, such as cigarettes, e-cigarettes, and chewing tobacco. If you need help quitting, ask your health care provider. °· Limit your alcohol intake. °? Do not drink alcohol if: °§ Your health care provider tells you not to drink. °§ You are pregnant, may be pregnant, or are planning to become pregnant. °? If you drink alcohol: °§ Limit how much you use to: °§ 0-1 drink a day for women. °§ 0-2 drinks a day for men. °§ Be aware of how much alcohol is in your drink. In the [place] one drink equals one 12 oz bottle of beer (355 mL), one 5 oz glass of wine (148 mL), or one 1½ oz glass of hard liquor (44 mL). °Activity ° °· Get enough exercise. Each week, do at   least 150 minutes of exercise that takes a medium level of effort (moderate-intensity exercise). ? This is exercise that:  Makes your heart beat faster and makes you breathe harder than usual.  Allows you to still be able to talk. ? You could exercise in short sessions several times a day or longer sessions a few times a week. For example, on 5 days each week, you could walk fast or ride your bike 3 times a day for 10 minutes each time.  Do exercises as told  by your health care provider. Medicines  In addition to diet and lifestyle changes, your health care provider may recommend medicines to help lower cholesterol. This may be a medicine to lower the amount of cholesterol your liver makes. You may need medicine if: ? Diet and lifestyle changes do not lower your cholesterol enough. ? You have high cholesterol and other risk factors for heart disease or stroke.  Take over-the-counter and prescription medicines only as told by your health care provider. General information  Manage your risk factors for high cholesterol. Talk with your health care provider about all your risk factors and how to lower your risk.  Manage other conditions that you have, such as diabetes or high blood pressure (hypertension).  Have blood tests to check your cholesterol levels at regular points in time as told by your health care provider.  Keep all follow-up visits as told by your health care provider. This is important. Where to find more information  American Heart Association: www.heart.org  National Heart, Lung, and Blood Institute: PopSte[employer].is Summary  High cholesterol increases your risk for heart disease and stroke. By keeping your cholesterol level low, you can reduce your risk for these conditions.  High cholesterol can often be prevented with diet and lifestyle changes.  Work with your health care provider to manage your risk factors, and have your blood tested regularly. This information is not intended to replace advice given to you by your health care provider. Make sure you discuss any questions you have with your health care provider. Document Revised: 07/10/2018 Document Reviewed: 11/25/2015 Elsevier Patient Education  2020 [employer]. Varenicline oral tablets What is this medicine? VARENICLINE (var e NI kleen) is used to help people quit smoking. It is used with a patient support program recommended by your physician. This medicine  may be used for other purposes; ask your health care provider or pharmacist if you have questions. COMMON BRAND NAME(S): Chantix What should I tell my health care provider before I take this medicine? They need to know if you have any of these conditions:  heart disease  if you often drink alcohol  kidney disease  mental illness  on hemodialysis  seizures  history of stroke  suicidal thoughts, plans, or attempt; a previous suicide attempt by you or a family member  an unusual or allergic reaction to varenicline, other medicines, foods, dyes, or preservatives  pregnant or trying to get pregnant  breast-feeding How should I use this medicine? Take this medicine by mouth after eating. Take with a full glass of water. Follow the directions on the prescription label. Take your doses at regular intervals. Do not take your medicine more often than directed. There are 3 ways you can use this medicine to help you quit smoking; talk to your health care professional to decide which plan is right for you: 1) you can choose a quit date and start this medicine 1 week before the quit date, or, 2) you can  start taking this medicine before you choose a quit date, and then pick a quit date between day 8 and 35 days of treatment, or, 3) if you are not sure that you are able or willing to quit smoking right away, start taking this medicine and slowly decrease the amount you smoke as directed by your health care professional with the goal of being cigarette-free by week 12 of treatment. Stick to your plan; ask about support groups or other ways to help you remain cigarette-free. If you are motivated to quit smoking and did not succeed during a previous attempt with this medicine for reasons other than side effects, or if you returned to smoking after this treatment, speak with your health care professional about whether another course of this medicine may be right for you. A special MedGuide will be  given to you by the pharmacist with each prescription and refill. Be sure to read this information carefully each time. Talk to your pediatrician regarding the use of this medicine in children. This medicine is not approved for use in children. Overdosage: If you think you have taken too much of this medicine contact a poison control center or emergency room at once. NOTE: This medicine is only for you. Do not share this medicine with others. What if I miss a dose? If you miss a dose, take it as soon as you can. If it is almost time for your next dose, take only that dose. Do not take double or extra doses. What may interact with this medicine?  alcohol  insulin  other medicines used to help people quit smoking  theophylline  warfarin This list may not describe all possible interactions. Give your health care provider a list of all the medicines, herbs, non-prescription drugs, or dietary supplements you use. Also tell them if you smoke, drink alcohol, or use illegal drugs. Some items may interact with your medicine. What should I watch for while using this medicine? It is okay if you do not succeed at your attempt to quit and have a cigarette. You can still continue your quit attempt and keep using this medicine as directed. Just throw away your cigarettes and get back to your quit plan. Talk to your health care provider before using other treatments to quit smoking. Using this medicine with other treatments to quit smoking may increase the risk for side effects compared to using a treatment alone. You may get drowsy or dizzy. Do not drive, use machinery, or do anything that needs mental alertness until you know how this medicine affects you. Do not stand or sit up quickly, especially if you are an older patient. This reduces the risk of dizzy or fainting spells. Decrease the number of alcoholic beverages that you drink during treatment with this medicine until you know if this medicine affects  your ability to tolerate alcohol. Some people have experienced increased drunkenness (intoxication), unusual or sometimes aggressive behavior, or no memory of things that have happened (amnesia) during treatment with this medicine. Sleepwalking can happen during treatment with this medicine, and can sometimes lead to behavior that is harmful to you, other people, or property. Stop taking this medicine and tell your doctor if you start sleepwalking or have other unusual sleep-related activity. After taking this medicine, you may get up out of bed and do an activity that you do not know you are doing. The next morning, you may have no memory of this. Activities include driving a car ("sleep-driving"), making and eating  food, talking on the phone, sexual activity, and sleep-walking. Serious injuries have occurred. Stop the medicine and call your doctor right away if you find out you have done any of these activities. Do not take this medicine if you have used alcohol that evening. Do not take it if you have taken another medicine for sleep. The risk of doing these sleep-related activities is higher. Patients and their families should watch out for new or worsening depression or thoughts of suicide. Also watch out for sudden changes in feelings such as feeling anxious, agitated, panicky, irritable, hostile, aggressive, impulsive, severely restless, overly excited and hyperactive, or not being able to sleep. If this happens, call your health care professional. If you have diabetes and you quit smoking, the effects of insulin may be increased and you may need to reduce your insulin dose. Check with your doctor or health care professional about how you should adjust your insulin dose. What side effects may I notice from receiving this medicine? Side effects that you should report to your doctor or health care professional as soon as possible:  allergic reactions like skin rash, itching or hives, swelling of the  face, lips, tongue, or throat  acting aggressive, being angry or violent, or acting on dangerous impulses  breathing problems  changes in emotions or moods  chest pain or chest tightness  feeling faint or lightheaded, falls  hallucination, loss of contact with reality  mouth sores  redness, blistering, peeling or loosening of the skin, including inside the mouth  signs and symptoms of a stroke like changes in vision; confusion; trouble speaking or understanding; severe headaches; sudden numbness or weakness of the face, arm or leg; trouble walking; dizziness; loss of balance or coordination  seizures  sleepwalking  suicidal thoughts or other mood changes Side effects that usually do not require medical attention (report to your doctor or health care professional if they continue or are bothersome):  constipation  gas  headache  nausea, vomiting  strange dreams  trouble sleeping This list may not describe all possible side effects. Call your doctor for medical advice about side effects. You may report side effects to FDA at 1-800-FDA-1088. Where should I keep my medicine? Keep out of the reach of children. Store at room temperature between 15 and 30 degrees C (59 and 86 degrees F). Throw away any unused medicine after the expiration date. NOTE: This sheet is a summary. It may not cover all possible information. If you have questions about this medicine, talk to your doctor, pharmacist, or health care provider.  2020 Elsevier/Gold Standard (2018-03-06 14:27:36)  Steps to Quit Smoking Smoking tobacco is the leading cause of preventable death. It can affect almost every organ in the body. Smoking puts you and people around you at risk for many serious, long-lasting (chronic) diseases. Quitting smoking can be hard, but it is one of the best things that you can do for your health. It is never too late to quit. How do I get ready to quit? When you decide to quit smoking,  make a plan to help you succeed. Before you quit:  Pick a date to quit. Set a date within the next 2 weeks to give you time to prepare.  Write down the reasons why you are quitting. Keep this list in places where you will see it often.  Tell your family, friends, and co-workers that you are quitting. Their support is important.  Talk with your doctor about the choices that may help  you quit.  Find out if your health insurance will pay for these treatments.  Know the people, places, things, and activities that make you want to smoke (triggers). Avoid them. What first steps can I take to quit smoking?  Throw away all cigarettes at home, at work, and in your car.  Throw away the things that you use when you smoke, such as ashtrays and lighters.  Clean your car. Make sure to empty the ashtray.  Clean your home, including curtains and carpets. What can I do to help me quit smoking? Talk with your doctor about taking medicines and seeing a counselor at the same time. You are more likely to succeed when you do both.  If you are pregnant or breastfeeding, talk with your doctor about counseling or other ways to quit smoking. Do not take medicine to help you quit smoking unless your doctor tells you to do so. To quit smoking: Quit right away  Quit smoking totally, instead of slowly cutting back on how much you smoke over a period of time.  Go to counseling. You are more likely to quit if you go to counseling sessions regularly. Take medicine You may take medicines to help you quit. Some medicines need a prescription, and some you can buy over-the-counter. Some medicines may contain a drug called nicotine to replace the nicotine in cigarettes. Medicines may:  Help you to stop having the desire to smoke (cravings).  Help to stop the problems that come when you stop smoking (withdrawal symptoms). Your doctor may ask you to use:  Nicotine patches, gum, or lozenges.  Nicotine inhalers or  sprays.  Non-nicotine medicine that is taken by mouth. Find resources Find resources and other ways to help you quit smoking and remain smoke-free after you quit. These resources are most helpful when you use them often. They include:  Online chats with a Veterinary surgeoncounselor.  Phone quitlines.  Printed Materials engineerself-help materials.  Support groups or group counseling.  Text messaging programs.  Mobile phone apps. Use apps on your mobile phone or tablet that can help you stick to your quit plan. There are many free apps for mobile phones and tablets as well as websites. Examples include Quit Guide from the [employer] and smokefree.gov  What things can I do to make it easier to quit?   Talk to your family and friends. Ask them to support and encourage you.  Call a phone quitline (1-800-QUIT-NOW), reach out to support groups, or work with a Veterinary surgeoncounselor.  Ask people who smoke to not smoke around you.  Avoid places that make you want to smoke, such as: ? Bars. ? Parties. ? Smoke-break areas at work.  Spend time with people who do not smoke.  Lower the stress in your life. Stress can make you want to smoke. Try these things to help your stress: ? Getting regular exercise. ? Doing deep-breathing exercises. ? Doing yoga. ? Meditating. ? Doing a body scan. To do this, close your eyes, focus on one area of your body at a time from head to toe. Notice which parts of your body are tense. Try to relax the muscles in those areas. How will I feel when I quit smoking? Day 1 to 3 weeks Within the first 24 hours, you may start to have some problems that come from quitting tobacco. These problems are very bad 2-3 days after you quit, but they do not often last for more than 2-3 weeks. You may get these symptoms:  Mood  swings.  Feeling restless, nervous, angry, or annoyed.  Trouble concentrating.  Dizziness.  Strong desire for high-sugar foods and nicotine.  Weight gain.  Trouble pooping  (constipation).  Feeling like you may vomit (nausea).  Coughing or a sore throat.  Changes in how the medicines that you take for other issues work in your body.  Depression.  Trouble sleeping (insomnia). Week 3 and afterward After the first 2-3 weeks of quitting, you may start to notice more positive results, such as:  Better sense of smell and taste.  Less coughing and sore throat.  Slower heart rate.  Lower blood pressure.  Clearer skin.  Better breathing.  Fewer sick days. Quitting smoking can be hard. Do not give up if you fail the first time. Some people need to try a few times before they succeed. Do your best to stick to your quit plan, and talk with your doctor if you have any questions or concerns. Summary  Smoking tobacco is the leading cause of preventable death. Quitting smoking can be hard, but it is one of the best things that you can do for your health.  When you decide to quit smoking, make a plan to help you succeed.  Quit smoking right away, not slowly over a period of time.  When you start quitting, seek help from your doctor, family, or friends. This information is not intended to replace advice given to you by your health care provider. Make sure you discuss any questions you have with your health care provider. Document Revised: 12/11/2018 Document Reviewed: 06/06/2018 Elsevier Patient Education  2020 [employer].

## 2019-10-15 NOTE — Progress Notes (Signed)
Established Patient Office Visit  Subjective:  Patient ID: Philip Hart, male    DOB: 1971/01/05  Age: 49 y.o. MRN: [id number]  CC:  Chief Complaint  Patient presents with  . Follow-up    1 month- for Bp and chest pain-pt said he hasn't checked his bp lately but feels like its doing better//pt saids no chest pains    HPI Philip Hart presents for follow-up on his hypertension.  Blood pressure under much better control with the lisinopril denies that she is taking it.  No new cough.  Chest pain has resolved.  Previously reported sadness is lifted some, however he did have to put a dog down recently he had been with him for 10 years.  He has taken Chantix in the past and had no side effects from it.  He did not help but he admits that he probably was not ready to quit.  His wife quit smoking 3 months ago.  Past Medical History:  Diagnosis Date  . Chicken pox     Past Surgical History:  Procedure Laterality Date  . VASECTOMY      Family History  Problem Relation Age of Onset  . Diabetes Mother   . Hypertension Mother   . Heart disease Father   . Hypertension Father   . Alcohol abuse Maternal Grandfather   . Hypertension Brother   . Stroke Brother     Social History   Socioeconomic History  . Marital status: Married    Spouse name: Not on file  . Number of children: Not on file  . Years of education: Not on file  . Highest education level: Not on file  Occupational History  . Not on file  Tobacco Use  . Smoking status: Current Every Day Smoker    Packs/day: 1.00    Years: 20.00    Pack years: 20.00    Types: Cigarettes  . Smokeless tobacco: Never Used  Vaping Use  . Vaping Use: Some days  Substance and Sexual Activity  . Alcohol use: Yes    Alcohol/week: 4.0 standard drinks    Types: 4 Cans of beer per week    Comment: beer-3-4 per daily  . Drug use: No  . Sexual activity: Yes  Other Topics Concern  . Not on file  Social History Narrative  . Not on  file   Social Determinants of Health   Financial Resource Strain:   . Difficulty of Paying Living Expenses:   Food Insecurity:   . Worried About Programme researcher, broadcasting/film/video in the Last Year:   . Barista in the Last Year:   Transportation Needs:   . Freight forwarder (Medical):   Marland Kitchen Lack of Transportation (Non-Medical):   Physical Activity:   . Days of Exercise per Week:   . Minutes of Exercise per Session:   Stress:   . Feeling of Stress :   Social Connections:   . Frequency of Communication with Friends and Family:   . Frequency of Social Gatherings with Friends and Family:   . Attends Religious Services:   . Active Member of Clubs or Organizations:   . Attends Banker Meetings:   Marland Kitchen Marital Status:   Intimate Partner Violence:   . Fear of Current or Ex-Partner:   . Emotionally Abused:   Marland Kitchen Physically Abused:   . Sexually Abused:     Outpatient Medications Prior to Visit  Medication Sig Dispense Refill  . ibuprofen (ADVIL)  200 MG tablet Take 200 mg by mouth every 6 (six) hours as needed.    Marland Kitchen lisinopril (ZESTRIL) 20 MG tablet Take 1 tablet (20 mg total) by mouth daily. 90 tablet 3  . PROCTOSOL HC 2.5 % rectal cream PLACE 1 APPLICATION RECTALLY 2 (TWO) TIMES DAILY. (Patient not taking: Reported on 09/14/2019) 28.35 g 0  . triamcinolone ointment (KENALOG) 0.1 % Apply 1 application topically 2 (two) times daily. (Patient not taking: Reported on 01/04/2016) 90 g 1  . valACYclovir (VALTREX) 1000 MG tablet TAKE 1 TABLET BY MOUTH TWICE A DAY (Patient not taking: Reported on 09/14/2019) 60 tablet 0   No facility-administered medications prior to visit.    No Known Allergies  ROS Review of Systems  Constitutional: Negative.   Respiratory: Negative for chest tightness and shortness of breath.   Cardiovascular: Negative.   Gastrointestinal: Negative.   Neurological: Negative for headaches.  Psychiatric/Behavioral: Negative for dysphoric mood.      Objective:      Physical Exam Vitals and nursing note reviewed.  Constitutional:      General: He is not in acute distress.    Appearance: Normal appearance. He is not ill-appearing or toxic-appearing.  HENT:     Head: Normocephalic and atraumatic.     Right Ear: External ear normal.     Left Ear: External ear normal.  Eyes:     General: No scleral icterus.       Right eye: No discharge.        Left eye: No discharge.     Conjunctiva/sclera: Conjunctivae normal.  Pulmonary:     Effort: Pulmonary effort is normal.  Skin:    General: Skin is warm and dry.  Neurological:     Mental Status: He is alert and oriented to person, place, and time.  Psychiatric:        Mood and Affect: Mood normal.        Behavior: Behavior normal.     BP 128/74   Pulse 65   Temp (!) 97.5 F (36.4 C) (Tympanic)   Ht 5\' 5"  (1.651 m)   Wt 150 lb 6.4 oz (68.2 kg)   SpO2 97%   BMI 25.03 kg/m  Wt Readings from Last 3 Encounters:  10/15/19 150 lb 6.4 oz (68.2 kg)  09/14/19 151 lb 6.4 oz (68.7 kg)  01/04/16 147 lb (66.7 kg)     Health Maintenance Due  Topic Date Due  . COVID-19 Vaccine (1) Never done    There are no preventive care reminders to display for this patient.  Lab Results  Component Value Date   TSH 3.18 09/14/2019   Lab Results  Component Value Date   WBC 8.5 09/14/2019   HGB 17.5 (H) 09/14/2019   HCT 50.6 09/14/2019   MCV 99.8 09/14/2019   PLT 113.0 (L) 09/14/2019   Lab Results  Component Value Date   NA 139 09/14/2019   K 4.5 09/14/2019   CO2 24 09/14/2019   GLUCOSE 84 09/14/2019   BUN 12 09/14/2019   CREATININE 1.03 09/14/2019   BILITOT 0.7 09/14/2019   ALKPHOS 45 09/14/2019   AST 18 09/14/2019   ALT 27 09/14/2019   PROT 7.6 09/14/2019   ALBUMIN 4.6 09/14/2019   CALCIUM 9.7 09/14/2019   GFR 77.06 09/14/2019   Lab Results  Component Value Date   CHOL 207 (H) 07/18/2015   Lab Results  Component Value Date   HDL 70.10 07/18/2015   Lab Results  Component Value  Date   LDLCALC 121 (H) 07/18/2015   Lab Results  Component Value Date   TRIG 80.0 07/18/2015   Lab Results  Component Value Date   CHOLHDL 3 07/18/2015   Lab Results  Component Value Date   HGBA1C 5.9 09/14/2019      Assessment & Plan:   Problem List Items Addressed This Visit      Cardiovascular and Mediastinum   Essential hypertension - Primary   Relevant Medications   lisinopril (ZESTRIL) 20 MG tablet   Other Relevant Orders   Basic metabolic panel     Other   Tobacco use   Elevated cholesterol   Relevant Medications   lisinopril (ZESTRIL) 20 MG tablet      Meds ordered this encounter  Medications  . lisinopril (ZESTRIL) 20 MG tablet    Sig: Take 1 tablet (20 mg total) by mouth daily.    Dispense:  90 tablet    Refill:  3    Follow-up: Return in about 6 months (around 04/16/2020).  We talked for a long time about quitting smoking and moderating alcohol and that his life would certainly go better without tobacco and probably the alcohol as well.  Given information on Chantix.  Expressed my hope that he would start hunting again this fall doing the other things that brought him joy in the past.  He was given information on lowering the cholesterol as well.  Mliss Sax, MD

## 2019-10-16 LAB — BASIC METABOLIC PANEL
BUN/Creatinine Ratio: 10 (ref 9–20)
BUN: 9 mg/dL (ref 6–24)
CO2: 20 mmol/L (ref 20–29)
Calcium: 9.8 mg/dL (ref 8.7–10.2)
Chloride: 103 mmol/L (ref 96–106)
Creatinine, Ser: 0.91 mg/dL (ref 0.76–1.27)
GFR calc Af Amer: 115 mL/min/{1.73_m2} (ref >59)
GFR calc non Af Amer: 99 mL/min/{1.73_m2} (ref >59)
Glucose: 88 mg/dL (ref 65–99)
Potassium: 4.6 mmol/L (ref 3.5–5.2)
Sodium: 139 mmol/L (ref 134–144)

## 2020-04-10 ENCOUNTER — Emergency Department (HOSPITAL_COMMUNITY)
Admission: EM | Admit: 2020-04-10 | Discharge: 2020-04-10 | Disposition: A | Discharge status: Home / Self Care | Payer: BC Managed Care – PPO | Attending: Emergency Medicine | Admitting: Emergency Medicine

## 2020-04-10 ENCOUNTER — Other Ambulatory Visit: Payer: Self-pay

## 2020-04-10 DIAGNOSIS — R509 Fever, unspecified: Secondary | ICD-10-CM | POA: Diagnosis present

## 2020-04-10 DIAGNOSIS — F17210 Nicotine dependence, cigarettes, uncomplicated: Secondary | ICD-10-CM | POA: Diagnosis not present

## 2020-04-10 DIAGNOSIS — Z79899 Other long term (current) drug therapy: Secondary | ICD-10-CM | POA: Insufficient documentation

## 2020-04-10 DIAGNOSIS — B349 Viral infection, unspecified: Secondary | ICD-10-CM

## 2020-04-10 DIAGNOSIS — U071 COVID-19: Secondary | ICD-10-CM | POA: Insufficient documentation

## 2020-04-10 DIAGNOSIS — I10 Essential (primary) hypertension: Secondary | ICD-10-CM | POA: Insufficient documentation

## 2020-04-10 MED ORDER — BENZONATATE 100 MG PO CAPS: 100.0000 mg | ORAL_CAPSULE | Freq: Three times a day (TID) | ORAL | 0 refills | Status: DC

## 2020-04-10 NOTE — Discharge Instructions (Signed)
You have a COVID test pending. This may take several hours to come back. You can check online or use the MyChart App to look at your results.   You can take Tylenol or Ibuprofen as directed for pain. You can alternate Tylenol and Ibuprofen every 4 hours. If you take Tylenol at 1pm, then you can take Ibuprofen at 5pm. Then you can take Tylenol again at 9pm.   Make sure you are staying hydrated and drinking plenty of fluids.   Return to the Emergency Dept for any difficulty breathing, vomiting/inability to keep any food or liquid downs, chest pain or any other worsening or concerning symptoms.  

## 2020-04-10 NOTE — ED Notes (Signed)
An After Visit Summary was printed and given to the patient. Discharge instructions given and no further questions at this time.  

## 2020-04-10 NOTE — ED Triage Notes (Signed)
Pt presents with c/o generalized body aches and fever since last night. Pt reports he has had both vaccines, no known covid exposures.

## 2020-04-10 NOTE — ED Provider Notes (Signed)
[facility] Provider Note   CSN: [id number] Arrival date & time: 04/10/20  1143     History Chief Complaint  Patient presents with  . Fever  . Generalized Body Aches    Philip Hart is a 50 y.o. male physical history of hypertension who presents for evaluation of 3 days of generalized body aches, fever/chills, cough, nasal congestion, rhinorrhea.  He states cough is not productive.  He states he does not know if it is a worsening cough or it is a cough from when he smokes.  He states he is not having trouble breathing.  He states that his fever has been up to 101.  He states that he has not had any sore throat, difficulty swallowing.  He does smoke about half pack cigarettes a day.  He has been vaccinated for COVID x2.  He has not received a booster.  He reports wife has been at home with similar symptoms.  He does not know any other COVID exposure.  The history is provided by the patient.       Past Medical History:  Diagnosis Date  . Chicken pox     Patient Active Problem List   Diagnosis Date Noted  . Elevated cholesterol 10/15/2019  . Essential hypertension 09/14/2019  . Chest pain 09/14/2019  . Dysthymia 09/14/2019  . Alcohol use 09/14/2019  . Fatigue 09/14/2019  . Rash and nonspecific skin eruption 01/06/2016  . Tobacco use 07/19/2015  . Routine general medical examination at a health care facility 10/15/2012    Past Surgical History:  Procedure Laterality Date  . VASECTOMY         Family History  Problem Relation Age of Onset  . Diabetes Mother   . Hypertension Mother   . Heart disease Father   . Hypertension Father   . Alcohol abuse Maternal Grandfather   . Hypertension Brother   . Stroke Brother     Social History   Tobacco Use  . Smoking status: Current Every Day Smoker    Packs/day: 1.00    Years: 20.00    Pack years: 20.00    Types: Cigarettes  . Smokeless tobacco: Never Used  Vaping Use  . Vaping Use:  Some days  Substance Use Topics  . Alcohol use: Yes    Alcohol/week: 4.0 standard drinks    Types: 4 Cans of beer per week    Comment: beer-3-4 per daily  . Drug use: No    Home Medications Prior to Admission medications   Medication Sig Start Date End Date Taking? Authorizing Provider  benzonatate (TESSALON) 100 MG capsule Take 1 capsule (100 mg total) by mouth every 8 (eight) hours. 04/10/20  Yes Graciella Freer A, PA-C  ibuprofen (ADVIL) 200 MG tablet Take 200 mg by mouth every 6 (six) hours as needed.    [provider]  lisinopril (ZESTRIL) 20 MG tablet Take 1 tablet (20 mg total) by mouth daily. 10/15/19   Mliss Sax, MD    Allergies    Patient has no known allergies.  Review of Systems   Review of Systems  Constitutional: Positive for chills, fatigue and fever.  Respiratory: Positive for cough.   Cardiovascular: Negative for chest pain.  Gastrointestinal: Negative for abdominal pain, nausea and vomiting.  Genitourinary: Negative for hematuria.  Musculoskeletal: Positive for myalgias.  Neurological: Negative for headaches.  All other systems reviewed and are negative.   Physical Exam Updated Vital Signs BP (!) 161/98   Pulse  80   Temp 99 F (37.2 C) (Oral)   Resp 18   SpO2 99%   Physical Exam Vitals and nursing note reviewed.  Constitutional:      Appearance: Normal appearance. He is well-developed and well-nourished.  HENT:     Head: Normocephalic and atraumatic.     Mouth/Throat:     Mouth: Oropharynx is clear and moist and mucous membranes are normal.  Eyes:     General: Lids are normal.     Extraocular Movements: EOM normal.     Conjunctiva/sclera: Conjunctivae normal.     Pupils: Pupils are equal, round, and reactive to light.  Cardiovascular:     Rate and Rhythm: Normal rate and regular rhythm.     Pulses: Normal pulses.     Heart sounds: Normal heart sounds. No murmur heard. No friction rub. No gallop.   Pulmonary:      Effort: Pulmonary effort is normal.     Breath sounds: Normal breath sounds.     Comments: Lungs clear to auscultation bilaterally.  Symmetric chest rise.  No wheezing, rales, rhonchi. Musculoskeletal:        General: Normal range of motion.     Cervical back: Full passive range of motion without pain.  Skin:    General: Skin is warm and dry.     Capillary Refill: Capillary refill takes less than 2 seconds.  Neurological:     Mental Status: He is alert and oriented to person, place, and time.  Psychiatric:        Mood and Affect: Mood and affect normal.        Speech: Speech normal.     ED Results / Procedures / Treatments   Labs (all labs ordered are listed, but only abnormal results are displayed) Labs Reviewed  SARS CORONAVIRUS 2 (TAT 6-24 HRS)    EKG None  Radiology No results found.  Procedures Procedures (including critical care time)  Medications Ordered in ED Medications - No data to display  ED Course  I have reviewed the triage vital signs and the nursing notes.  Pertinent labs & imaging results that were available during my care of the patient were reviewed by me and considered in my medical decision making (see chart for details).    MDM Rules/Calculators/A&P                          50 year old male who presents for evaluation of 3 days of fever, generalized body aches, fatigue, cough.  He reports that he has had COVID-vaccine x2.  He has not gotten booster.  No exposure that he knows of but he states wife has been at home with similar symptoms.  On initial ED arrival, he is afebrile, nontoxic vital signs are stable.  Lungs clear to auscultation bilaterally.  Patient is well-appearing on exam.  Concern for viral process versus COVID-19.  COVID test is pending.  I discussed with patient regarding at home supportive care measures if his COVID test is positive.  At this time, patient is hemodynamically stable. At this time, patient exhibits no emergent  life-threatening condition that require further evaluation in ED. Patient had ample opportunity for questions and discussion. All patient's questions were answered with full understanding. Strict return precautions discussed. Patient expresses understanding and agreement to plan.   Philip Hart was evaluated in Emergency Department on 04/10/2020 for the symptoms described in the history of present illness. He was evaluated in the context  of the global COVID-19 pandemic, which necessitated consideration that the patient might be at risk for infection with the SARS-CoV-2 virus that causes COVID-19. Institutional protocols and algorithms that pertain to the evaluation of patients at risk for COVID-19 are in a state of rapid change based on information released by regulatory bodies including the CDC and federal and state organizations. These policies and algorithms were followed during the patient's care in the ED.  Portions of this note were generated with Scientist, clinical (histocompatibility and immunogenetics). Dictation errors may occur despite best attempts at proofreading.   Final Clinical Impression(s) / ED Diagnoses Final diagnoses:  Viral illness    Rx / DC Orders ED Discharge Orders         Ordered    benzonatate (TESSALON) 100 MG capsule  Every 8 hours        04/10/20 1808           Maxwell Caul, PA-C 04/10/20 1815    Gwyneth Sprout, MD 04/10/20 2342

## 2020-04-11 LAB — SARS CORONAVIRUS 2 (TAT 6-24 HRS): SARS Coronavirus 2: POSITIVE — AB

## 2020-04-28 ENCOUNTER — Ambulatory Visit: Payer: BC Managed Care – PPO | Admitting: Family Medicine

## 2020-05-01 ENCOUNTER — Other Ambulatory Visit: Payer: Self-pay

## 2020-05-01 ENCOUNTER — Ambulatory Visit (INDEPENDENT_AMBULATORY_CARE_PROVIDER_SITE_OTHER): Payer: BC Managed Care – PPO | Admitting: Family Medicine

## 2020-05-01 ENCOUNTER — Encounter: Payer: Self-pay | Admitting: Family Medicine

## 2020-05-01 VITALS — BP 126/74 | HR 70 | Temp 98.4°F | Ht 65.0 in | Wt 152.6 lb

## 2020-05-01 DIAGNOSIS — R5382 Chronic fatigue, unspecified: Secondary | ICD-10-CM

## 2020-05-01 DIAGNOSIS — Z789 Other specified health status: Secondary | ICD-10-CM

## 2020-05-01 DIAGNOSIS — Z7289 Other problems related to lifestyle: Secondary | ICD-10-CM

## 2020-05-01 DIAGNOSIS — I10 Essential (primary) hypertension: Secondary | ICD-10-CM

## 2020-05-01 DIAGNOSIS — Z720 Tobacco use: Secondary | ICD-10-CM

## 2020-05-01 NOTE — Progress Notes (Signed)
[facility] [address] [place] [address] Dept: [phone] Dept Fax: [phone]  Acute Office Visit  Subjective:    Patient ID: Philip Hart, male    DOB: 21-Feb-1971, 50 y.o..   MRN: [id number]   Chief Complaint  Patient presents with  . Fatigue    Fatigue al the time does not seem to be getting better at all. Also lower back pains more frequent     History of Present Illness:  Patient is in today for a complaint of fatigue. He has apparently had this for quite some time and has had prior evaluation. Review of prior labs shows no sign of anemia, thyroid, or kidney disease. He was concerned that maybe his antihypertensive was causing his fatigue, but it is clear the fatigue predates his being started on medication. He did have a + COVID-19 test earlier in January. He still notes some occasional wheezy feeling on the right when lying down. He denies any depression or anxiety.  Philip Hart admits to alcohol use, primarily drinking 8+ cans of beer a day. He has considered stopping, but has not so far. He denies any tremor or need for an eye-opener.  Philip Hart uses tobacco, often smoking as he drinks. His wife smokes as well. He has tried to quit int he past but return quickly back to it. He has tried using nicotine gum and lozenges without much success.  Past Medical History: Patient Active Problem List   Diagnosis Date Noted  . Elevated cholesterol 10/15/2019  . Essential hypertension 09/14/2019  . Chest pain 09/14/2019  . Dysthymia 09/14/2019  . Alcohol use 09/14/2019  . Fatigue 09/14/2019  . Rash and nonspecific skin eruption 01/06/2016  . Tobacco use 07/19/2015  . Routine general medical examination at a health care facility 10/15/2012   Past Surgical History:  Procedure Laterality Date  . VASECTOMY     Family History  Problem Relation Age of Onset  . Diabetes Mother   . Hypertension Mother   . Heart disease  Father   . Hypertension Father   . Alcohol abuse Maternal Grandfather   . Hypertension Brother   . Stroke Brother    Outpatient Medications Prior to Visit  Medication Sig Dispense Refill  . lisinopril (ZESTRIL) 20 MG tablet Take 1 tablet (20 mg total) by mouth daily. 90 tablet 3  . benzonatate (TESSALON) 100 MG capsule Take 1 capsule (100 mg total) by mouth every 8 (eight) hours. (Patient not taking: Reported on 05/01/2020) 21 capsule 0  . ibuprofen (ADVIL) 200 MG tablet Take 200 mg by mouth every 6 (six) hours as needed. (Patient not taking: Reported on 05/01/2020)     No facility-administered medications prior to visit.    No Known Allergies  Objective:   Today's Vitals   05/01/20 1607  BP: 126/74  Pulse: 70  Temp: 98.4 F (36.9 C)  TempSrc: Temporal  SpO2: 96%  Weight: 152 lb 9.6 oz (69.2 kg)  Height: 5\' 5"  (1.651 m)   Body mass index is 25.39 kg/m.   General: Well developed, well nourished. No acute distress. Lungs: Clear to auscultation bilaterally. Neuro: No visible tremor. Psych: Alert and oriented x3. Normal mood and affect.  Health Maintenance Due  Topic Date Due  . COVID-19 Vaccine (1) Never done  . COLONOSCOPY (Pts 45-19yrs Insurance coverage will need to be confirmed)  Never done  . INFLUENZA VACCINE  10/31/2019     Assessment & Plan:   1.  Chronic fatigue I reviewed prior lab work, which was essentially normal. I do not see a clear underlying reason for his fatigue. I did explain that this may be exacerbated by his recent COVID infection.  2. Alcohol use This could be a contributor to his sense of fatigue. I expressed concern for the level of alcohol use he is having and suggested her reduce or quit his alcohol use.  3. Tobacco use I advised Philip Hart that he should quit smoking. I recommended he give thought to the long-term impacts of smoking and consider when he might be ready to quit. I offered to prescribe Chantix when he reaches that point.  4.  Essential hypertension Blood pressure is in good control. I do not belive he should stop his lisinopril at this point.  I recommend he follow-up in 3 months to reassess.  Loyola Mast, MD

## 2021-09-23 IMAGING — DX DG CHEST 2V
2 series · 2 of 2 positions shown · non-contrast
Comparison: None.

CLINICAL DATA: Chest pain and hypertension current tobacco user

EXAM:
CHEST - 2 VIEW

[chest pa]
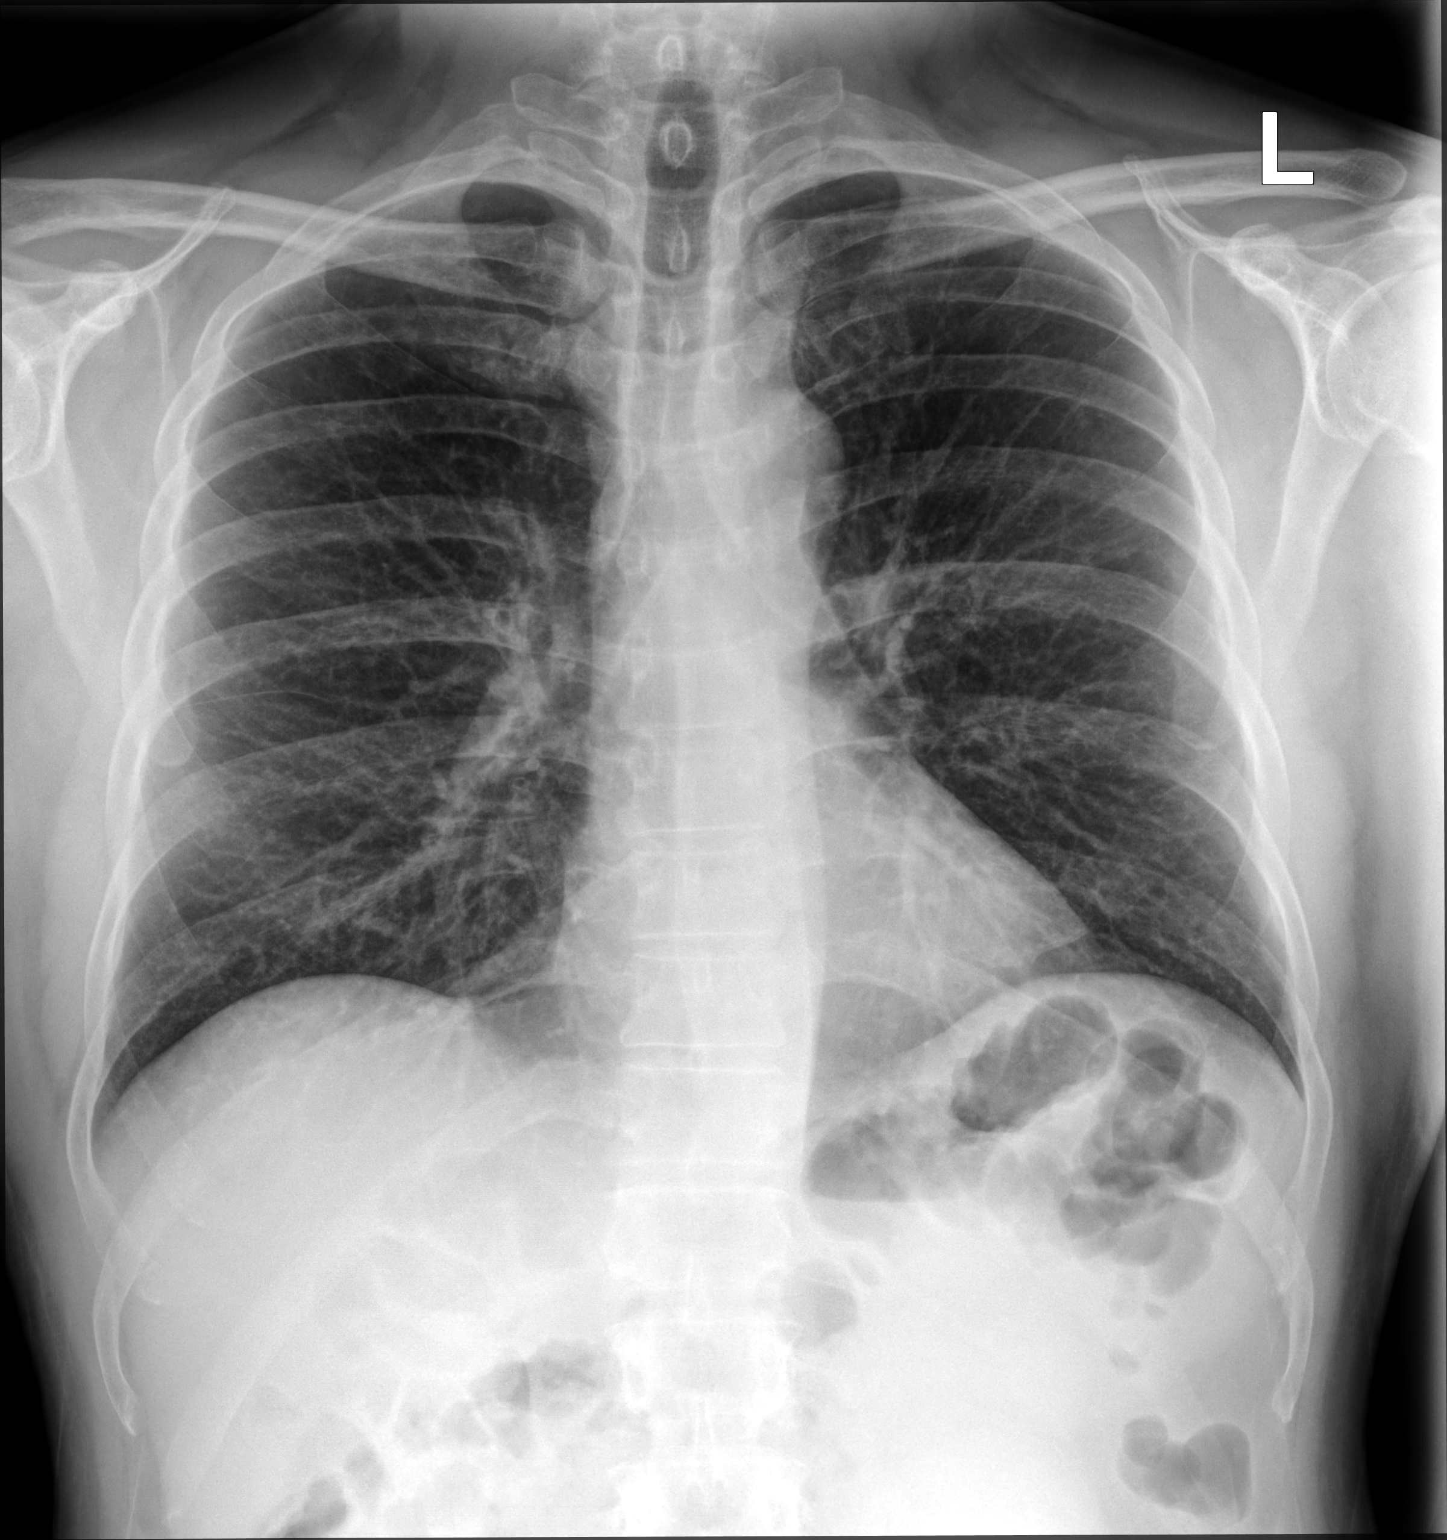

[chest lat]
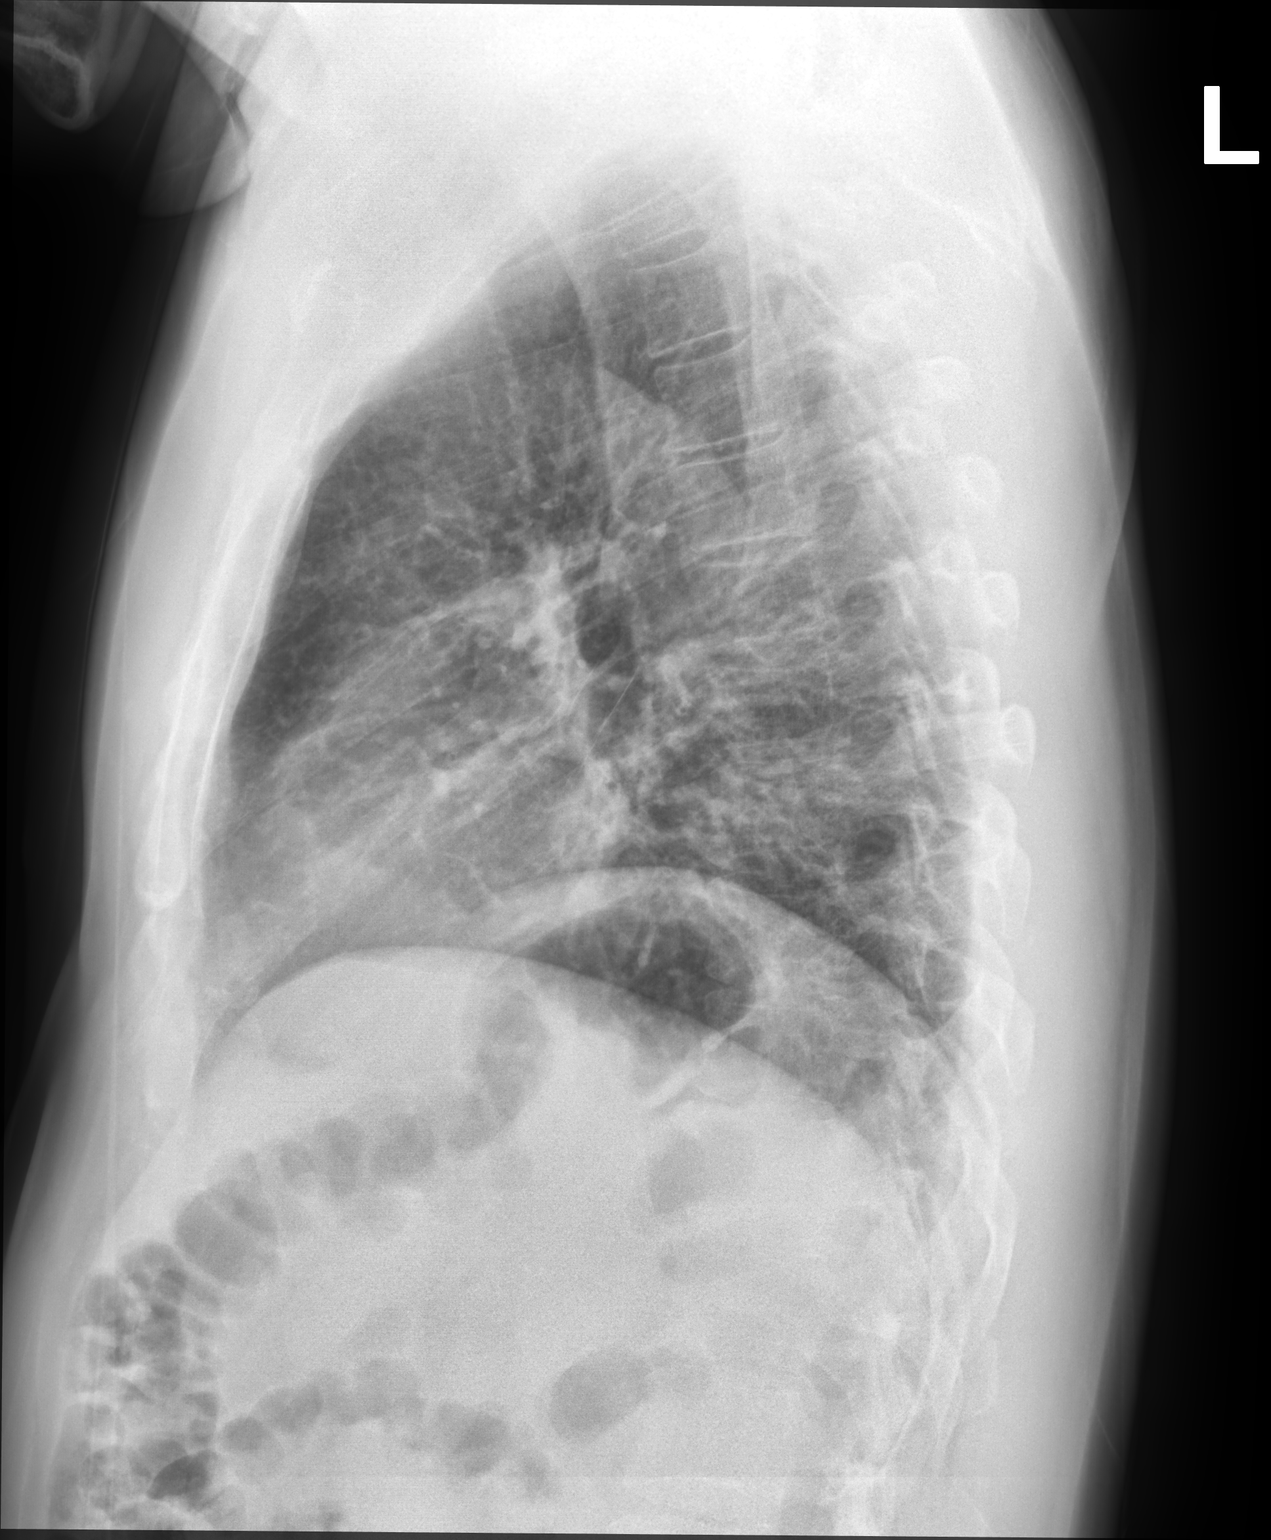

[2 of 2 positions shown; findings below may reference images not displayed]

FINDINGS: The heart size and mediastinal contours are within normal limits.
Chronic interstitial coarsening. No airspace opacity. No pleural
effusions or edema. The visualized skeletal structures are
unremarkable.
IMPRESSION: No active cardiopulmonary abnormalities.

## 2022-11-18 ENCOUNTER — Ambulatory Visit: Payer: Commercial Managed Care - PPO | Admitting: Family Medicine

## 2022-11-18 ENCOUNTER — Encounter: Payer: Self-pay | Admitting: Family Medicine

## 2022-11-18 VITALS — BP 142/78 | HR 76 | Temp 98.1°F | Ht 65.0 in | Wt 168.0 lb

## 2022-11-18 DIAGNOSIS — Z13220 Encounter for screening for lipoid disorders: Secondary | ICD-10-CM

## 2022-11-18 DIAGNOSIS — Z125 Encounter for screening for malignant neoplasm of prostate: Secondary | ICD-10-CM

## 2022-11-18 DIAGNOSIS — M25511 Pain in right shoulder: Secondary | ICD-10-CM

## 2022-11-18 DIAGNOSIS — M25512 Pain in left shoulder: Secondary | ICD-10-CM

## 2022-11-18 DIAGNOSIS — Z0001 Encounter for general adult medical examination with abnormal findings: Secondary | ICD-10-CM

## 2022-11-18 DIAGNOSIS — K625 Hemorrhage of anus and rectum: Secondary | ICD-10-CM

## 2022-11-18 DIAGNOSIS — M79642 Pain in left hand: Secondary | ICD-10-CM

## 2022-11-18 DIAGNOSIS — M79641 Pain in right hand: Secondary | ICD-10-CM | POA: Diagnosis not present

## 2022-11-18 DIAGNOSIS — I10 Essential (primary) hypertension: Secondary | ICD-10-CM

## 2022-11-18 DIAGNOSIS — Z720 Tobacco use: Secondary | ICD-10-CM

## 2022-11-18 DIAGNOSIS — Z0000 Encounter for general adult medical examination without abnormal findings: Secondary | ICD-10-CM

## 2022-11-18 LAB — URINALYSIS, ROUTINE W REFLEX MICROSCOPIC
Bilirubin Urine: NEGATIVE
Hgb urine dipstick: NEGATIVE
Ketones, ur: NEGATIVE
Leukocytes,Ua: NEGATIVE
Nitrite: NEGATIVE
RBC / HPF: NONE SEEN (ref 0–?)
Specific Gravity, Urine: >=1.03 — AB (ref 1.000–1.030)
Total Protein, Urine: NEGATIVE
Urine Glucose: NEGATIVE
Urobilinogen, UA: 0.2 (ref 0.0–1.0)
pH: 6 (ref 5.0–8.0)

## 2022-11-18 MED ORDER — LISINOPRIL 20 MG PO TABS
20.0000 mg | ORAL_TABLET | Freq: Every day | ORAL | 2 refills | Status: DC
Start: 2022-11-18 — End: 2023-02-13

## 2022-11-18 NOTE — Progress Notes (Signed)
Established Patient Office Visit   Subjective:  Patient ID: Philip Hart, male    DOB: 1970/04/08  Age: 52 y.o. MRN: [id number]  Chief Complaint  Patient presents with   Shoulder Pain    Biliateral shoulder pain right over left x 3 months.    Hand Pain    Bilateral hand pain right over left.    Hemorrhoids    Hemorrhoid issue. Pt states he has had a lot of bleeding when using the bathroom.     Shoulder Pain  Pertinent negatives include no tingling.  Hand Pain  Pertinent negatives include no tingling.   Encounter Diagnoses  Name Primary?   Healthcare maintenance Yes   Essential hypertension    Screening for cholesterol level    Pain in both hands    Screening for prostate cancer    Rectal bleeding    Bilateral shoulder pain, unspecified chronicity    Tobacco use    Here for physical and follow-up above hypertension and some current medical issues and concerns.  Lost to follow-up for 3 years now.  Has been taking lisinopril.  Continues to work.  He lives at home with his wife.  Decrease tobacco usage to one half a pack per day.  He did quit drinking.  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  Has been experiencing some bleeding from his rectum and into the toilet bowl.  Feels as though he has a hemorrhoid.  He does enjoy sitting on the toilet and playing on his phone.  Tells of pain and stiffness in both of his hands and shoulders.  He drives a forklift.  He is not exercising regularly and does not have regular dental care.  He does have access to dental care   Review of Systems  Constitutional: Negative.   HENT: Negative.    Eyes:  Negative for blurred vision, discharge and redness.  Respiratory: Negative.    Cardiovascular: Negative.   Gastrointestinal:  Negative for abdominal pain.  Genitourinary: Negative.   Musculoskeletal:  Positive for joint pain. Negative for myalgias.  Skin:  Negative for rash.  Neurological:  Negative for tingling, loss of consciousness and weakness.   Endo/Heme/Allergies:  Negative for polydipsia.     Current Outpatient Medications:    lisinopril (ZESTRIL) 20 MG tablet, Take 1 tablet (20 mg total) by mouth daily., Disp: 30 tablet, Rfl: 2   Objective:     BP (!) 142/78   Pulse 76   Temp 98.1 F (36.7 C)   Ht 5\' 5"  (1.651 m)   Wt 168 lb (76.2 kg)   BMI 27.96 kg/m  BP Readings from Last 3 Encounters:  11/18/22 (!) 142/78  05/01/20 126/74  04/10/20 (!) 149/98   Wt Readings from Last 3 Encounters:  11/18/22 168 lb (76.2 kg)  05/01/20 152 lb 9.6 oz (69.2 kg)  10/15/19 150 lb 6.4 oz (68.2 kg)      Physical Exam Constitutional:      General: He is not in acute distress.    Appearance: Normal appearance. He is not ill-appearing, toxic-appearing or diaphoretic.  HENT:     Head: Normocephalic and atraumatic.     Right Ear: Tympanic membrane, ear canal and external ear normal.     Left Ear: Tympanic membrane, ear canal and external ear normal.     Mouth/Throat:     Mouth: Mucous membranes are moist.     Pharynx: Oropharynx is clear. No oropharyngeal exudate or posterior oropharyngeal erythema.  Eyes:  General: No scleral icterus.       Right eye: No discharge.        Left eye: No discharge.     Extraocular Movements: Extraocular movements intact.     Conjunctiva/sclera: Conjunctivae normal.     Pupils: Pupils are equal, round, and reactive to light.  Cardiovascular:     Rate and Rhythm: Normal rate and regular rhythm.  Pulmonary:     Effort: Pulmonary effort is normal. No respiratory distress.     Breath sounds: Normal breath sounds. No wheezing, rhonchi or rales.  Abdominal:     General: Bowel sounds are normal.     Tenderness: There is no abdominal tenderness. There is no guarding or rebound.     Hernia: No hernia is present.  Genitourinary:    Prostate: Not enlarged, not tender and no nodules present.     Rectum: Guaiac result positive. External hemorrhoid present. No mass, tenderness, anal fissure or  internal hemorrhoid. Normal anal tone.  Musculoskeletal:     Cervical back: No rigidity or tenderness.  Lymphadenopathy:     Cervical: No cervical adenopathy.  Skin:    General: Skin is warm and dry.  Neurological:     Mental Status: He is alert and oriented to person, place, and time.  Psychiatric:        Mood and Affect: Mood normal.        Behavior: Behavior normal.      No results found for any visits on 11/18/22.    The ASCVD Risk score (Arnett DK, et al., 2019) failed to calculate for the following reasons:   Cannot find a previous HDL lab   Cannot find a previous total cholesterol lab    Assessment & Plan:   Healthcare maintenance -     Urinalysis, Routine w reflex microscopic  Essential hypertension -     CBC -     Comprehensive metabolic panel -     Lisinopril; Take 1 tablet (20 mg total) by mouth daily.  Dispense: 30 tablet; Refill: 2  Screening for cholesterol level -     Comprehensive metabolic panel -     Lipid panel  Pain in both hands -     Ambulatory referral to Sports Medicine -     Sedimentation rate -     C-reactive protein -     Rheumatoid factor  Screening for prostate cancer -     PSA  Rectal bleeding -     Ambulatory referral to Gastroenterology  Bilateral shoulder pain, unspecified chronicity -     Ambulatory referral to Sports Medicine  Tobacco use    Return in about 8 weeks (around 01/13/2023).  Information was given on health maintenance and disease prevention.  Encouraged him to pursue regular dental care.  Encouraged regular exercise for 30 minutes 5 days weekly.  Encouraged tobacco cessation.  He is not ready at this time.  Information was given about the negative health effects of smoking.  Restarted lisinopril.  Sports medicine referral for bilateral shoulder and hand pain.  Checking inflammatory markers.  GI referral for rectal bleeding and hemorrhoid.  Is due for colonoscopy.   Mliss Sax, MD

## 2023-01-03 ENCOUNTER — Ambulatory Visit: Payer: Commercial Managed Care - PPO | Admitting: Family Medicine

## 2023-01-06 ENCOUNTER — Ambulatory Visit: Payer: Commercial Managed Care - PPO | Admitting: Family Medicine

## 2023-01-06 ENCOUNTER — Telehealth: Payer: Self-pay | Admitting: Family Medicine

## 2023-01-06 NOTE — Telephone Encounter (Signed)
Pt was a no show for an OV with Dr Doreene Burke on 01/06/23, I sent a letter.

## 2023-01-07 NOTE — Telephone Encounter (Signed)
 1st no show, letter sent via mail

## 2023-01-08 ENCOUNTER — Ambulatory Visit: Payer: Commercial Managed Care - PPO | Admitting: Sports Medicine

## 2023-01-14 ENCOUNTER — Encounter: Payer: Self-pay | Admitting: Sports Medicine

## 2023-01-14 ENCOUNTER — Ambulatory Visit (HOSPITAL_BASED_OUTPATIENT_CLINIC_OR_DEPARTMENT_OTHER)
Admission: RE | Admit: 2023-01-14 | Discharge: 2023-01-14 | Disposition: A | Discharge status: Home / Self Care | Payer: Commercial Managed Care - PPO | Source: Ambulatory Visit | Attending: Sports Medicine | Admitting: Sports Medicine

## 2023-01-14 ENCOUNTER — Ambulatory Visit (INDEPENDENT_AMBULATORY_CARE_PROVIDER_SITE_OTHER): Payer: Commercial Managed Care - PPO | Admitting: Sports Medicine

## 2023-01-14 VITALS — BP 100/70 | Ht 65.0 in | Wt 168.0 lb

## 2023-01-14 DIAGNOSIS — M79642 Pain in left hand: Secondary | ICD-10-CM | POA: Diagnosis present

## 2023-01-14 DIAGNOSIS — M79641 Pain in right hand: Secondary | ICD-10-CM | POA: Insufficient documentation

## 2023-01-14 MED ORDER — MELOXICAM 15 MG PO TABS: ORAL_TABLET | ORAL | 0 refills | Status: DC

## 2023-01-14 NOTE — Progress Notes (Signed)
   Subjective:    Patient ID: Philip Hart, male    DOB: Aug 09, 1970, 52 y.o.   MRN: [id number]  HPI chief complaint: Bilateral shoulder and bilateral hand pain  Patient is a very pleasant 52 year old that comes in today complaining of longstanding pain in both shoulders and both hands.  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  He feels like his hand pain may be the result of that.  His pain is diffuse.  He has pain with gripping as well as with writing.  He does notice benefit with over-the-counter Tylenol and ibuprofen.  Shoulder pain is bilateral and worse first thing in the morning.  No limited range of motion.  No trauma.  Past medical history reviewed Medications reviewed Allergies reviewed   Review of Systems As above    Objective:   Physical Exam  Well-developed, well-nourished.  No acute distress  Examination of both shoulders show full range of motion.  No tenderness to palpation.  Good rotator cuff strength.  Examination of his hands show the ability to make a complete fist.  No swelling.  No tenderness to palpation.  Good grip strength.  No trigger fingers.  No evidence of Dupuytren's.  Good pulses.  AP x-rays of both hands show no significant degenerative changes      Assessment & Plan:   Chronic bilateral hand pain Chronic bilateral shoulder pain  Trial of meloxicam 15 mg with food for the next 5 days then as needed.  He will discontinue his ibuprofen when taking meloxicam.  He may continue with his Tylenol.  His PCP has ordered a sed rate, rheumatoid factor, and C-reactive protein.  I did recommend that Lorin Picket get those labs as ordered.  This note was dictated using Dragon naturally speaking software and may contain errors in syntax, spelling, or content which have not been identified prior to signing this note.

## 2023-01-22 ENCOUNTER — Encounter: Payer: Self-pay | Admitting: Sports Medicine

## 2023-01-22 ENCOUNTER — Ambulatory Visit (INDEPENDENT_AMBULATORY_CARE_PROVIDER_SITE_OTHER): Payer: Commercial Managed Care - PPO | Admitting: Sports Medicine

## 2023-01-22 VITALS — BP 110/80 | Ht 65.0 in | Wt 168.0 lb

## 2023-01-22 DIAGNOSIS — M79641 Pain in right hand: Secondary | ICD-10-CM

## 2023-01-22 DIAGNOSIS — M79642 Pain in left hand: Secondary | ICD-10-CM

## 2023-01-22 MED ORDER — PREDNISONE 10 MG PO TABS: ORAL_TABLET | ORAL | 0 refills | Status: DC

## 2023-01-22 NOTE — Progress Notes (Signed)
   Subjective:    Patient ID: Philip Hart, male    DOB: Sep 30, 1970, 52 y.o.   MRN: [id number]  HPI  Lorin Picket presents today with persistent bilateral hand pain.  Meloxicam was not helpful.  Pain continues to be diffuse in both hands.  However, it is worse along the palmar aspect of his left middle finger.  His symptoms are most noticeable with gripping.  He denies any locking of his fingers.  X-rays done recently showed no significant degenerative changes.   Review of Systems As above    Objective:   Physical Exam  Examination of both hands show no obvious swelling and no effusions.  He is able to make a complete fist bilaterally but does endorse pain with this.  No active triggering of any fingers.  Neurovascularly intact distally.      Assessment & Plan:   Persistent bilateral hand pain  Patient will stop his meloxicam and we will try 6-day Sterapred Dosepak.  He will MyChart message me early next week with an update.  If he notices significant improvement with the prednisone then he may require a referral to rheumatology.  If he notices no improvement on steroids then consider referral to the hand surgeon at [facility].  This note was dictated using Dragon naturally speaking software and may contain errors in syntax, spelling, or content which have not been identified prior to signing this note.

## 2023-02-13 ENCOUNTER — Other Ambulatory Visit: Payer: Self-pay

## 2023-02-13 ENCOUNTER — Other Ambulatory Visit: Payer: Self-pay | Admitting: Family Medicine

## 2023-02-13 DIAGNOSIS — I10 Essential (primary) hypertension: Secondary | ICD-10-CM

## 2023-02-13 MED ORDER — LISINOPRIL 20 MG PO TABS: 20.0000 mg | ORAL_TABLET | Freq: Every day | ORAL | 0 refills | Status: DC

## 2023-02-18 ENCOUNTER — Ambulatory Visit: Payer: Commercial Managed Care - PPO | Admitting: Sports Medicine

## 2023-02-20 ENCOUNTER — Ambulatory Visit: Payer: Commercial Managed Care - PPO | Admitting: Family Medicine

## 2023-02-20 NOTE — Progress Notes (Deleted)
Patient no show

## 2023-04-07 ENCOUNTER — Encounter: Payer: Self-pay | Admitting: Family Medicine

## 2023-04-07 ENCOUNTER — Ambulatory Visit (INDEPENDENT_AMBULATORY_CARE_PROVIDER_SITE_OTHER): Payer: Commercial Managed Care - PPO | Admitting: Family Medicine

## 2023-04-07 ENCOUNTER — Other Ambulatory Visit: Payer: Self-pay | Admitting: Family Medicine

## 2023-04-07 VITALS — BP 128/71 | HR 60 | Temp 97.9°F | Resp 18 | Wt 167.6 lb

## 2023-04-07 DIAGNOSIS — Z720 Tobacco use: Secondary | ICD-10-CM

## 2023-04-07 DIAGNOSIS — M79642 Pain in left hand: Secondary | ICD-10-CM

## 2023-04-07 DIAGNOSIS — Z13220 Encounter for screening for lipoid disorders: Secondary | ICD-10-CM

## 2023-04-07 DIAGNOSIS — Z0000 Encounter for general adult medical examination without abnormal findings: Secondary | ICD-10-CM | POA: Diagnosis not present

## 2023-04-07 DIAGNOSIS — Z125 Encounter for screening for malignant neoplasm of prostate: Secondary | ICD-10-CM | POA: Diagnosis not present

## 2023-04-07 DIAGNOSIS — I10 Essential (primary) hypertension: Secondary | ICD-10-CM | POA: Diagnosis not present

## 2023-04-07 DIAGNOSIS — M79641 Pain in right hand: Secondary | ICD-10-CM

## 2023-04-07 DIAGNOSIS — Z131 Encounter for screening for diabetes mellitus: Secondary | ICD-10-CM

## 2023-04-07 DIAGNOSIS — Z122 Encounter for screening for malignant neoplasm of respiratory organs: Secondary | ICD-10-CM

## 2023-04-07 DIAGNOSIS — N486 Induration penis plastica: Secondary | ICD-10-CM

## 2023-04-07 DIAGNOSIS — Z1211 Encounter for screening for malignant neoplasm of colon: Secondary | ICD-10-CM

## 2023-04-07 LAB — LIPID PANEL
Cholesterol: 190 mg/dL (ref 0–200)
HDL: 39.9 mg/dL (ref >39.00)
LDL Cholesterol: 122 mg/dL — ABNORMAL HIGH (ref 0–99)
NonHDL: 149.72
Total CHOL/HDL Ratio: 5
Triglycerides: 138 mg/dL (ref 0.0–149.0)
VLDL: 27.6 mg/dL (ref 0.0–40.0)

## 2023-04-07 LAB — COMPREHENSIVE METABOLIC PANEL
ALT: 11 U/L (ref 0–53)
AST: 12 U/L (ref 0–37)
Albumin: 4.5 g/dL (ref 3.5–5.2)
Alkaline Phosphatase: 33 U/L — ABNORMAL LOW (ref 39–117)
BUN: 13 mg/dL (ref 6–23)
CO2: 29 meq/L (ref 19–32)
Calcium: 9.8 mg/dL (ref 8.4–10.5)
Chloride: 105 meq/L (ref 96–112)
Creatinine, Ser: 0.95 mg/dL (ref 0.40–1.50)
GFR: 92.6 mL/min (ref >60.00)
Glucose, Bld: 100 mg/dL — ABNORMAL HIGH (ref 70–99)
Potassium: 4.1 meq/L (ref 3.5–5.1)
Sodium: 142 meq/L (ref 135–145)
Total Bilirubin: 0.3 mg/dL (ref 0.2–1.2)
Total Protein: 6.9 g/dL (ref 6.0–8.3)

## 2023-04-07 LAB — URINALYSIS, ROUTINE W REFLEX MICROSCOPIC
Bilirubin Urine: NEGATIVE
Hgb urine dipstick: NEGATIVE
Ketones, ur: NEGATIVE
Leukocytes,Ua: NEGATIVE
Nitrite: NEGATIVE
Specific Gravity, Urine: 1.025 (ref 1.000–1.030)
Total Protein, Urine: NEGATIVE
Urine Glucose: NEGATIVE
Urobilinogen, UA: 0.2 (ref 0.0–1.0)
pH: 6 (ref 5.0–8.0)

## 2023-04-07 LAB — CBC
HCT: 44.3 % (ref 39.0–52.0)
Hemoglobin: 14.9 g/dL (ref 13.0–17.0)
MCHC: 33.7 g/dL (ref 30.0–36.0)
MCV: 99.2 fL (ref 78.0–100.0)
Platelets: 177 10*3/uL (ref 150.0–400.0)
RBC: 4.47 Mil/uL (ref 4.22–5.81)
RDW: 13.4 % (ref 11.5–15.5)
WBC: 8 10*3/uL (ref 4.0–10.5)

## 2023-04-07 LAB — PSA: PSA: 1.28 ng/mL (ref 0.10–4.00)

## 2023-04-07 LAB — HEMOGLOBIN A1C: Hgb A1c MFr Bld: 6.1 % (ref 4.6–6.5)

## 2023-04-07 NOTE — Telephone Encounter (Signed)
 Copied from CRM [id number]. Topic: Clinical - Medication Refill >> Apr 07, 2023 10:10 AM Franky GRADE wrote: Most Recent Primary Care Visit:  Provider: BERNETA ELSIE SAYRE  Department: [facility]  Visit Type: OFFICE VISIT  Date: 11/18/2022  Medication: lisinopril  (ZESTRIL ) 20 MG tablet  Has the patient contacted their pharmacy? No (Agent: If no, request that the patient contact the pharmacy for the refill. If patient does not wish to contact the pharmacy document the reason why and proceed with request.) (Agent: If yes, when and what did the pharmacy advise?)  Is this the correct pharmacy for this prescription? Yes If no, delete pharmacy and type the correct one.  This is the patient's preferred pharmacy:  [id number] - [place], [place] - [address] [place] [address] Phone: [phone] Fax: [phone]   Has the prescription been filled recently? No  Is the patient out of the medication? No  Has the patient been seen for an appointment in the last year OR does the patient have an upcoming appointment? Yes  Can we respond through MyChart? Yes  Agent: Please be advised that Rx refills may take up to 3 business days. We ask that you follow-up with your pharmacy.

## 2023-04-07 NOTE — Progress Notes (Addendum)
 Established Patient Office Visit   Subjective:  Patient ID: Philip Hart, male    DOB: [birth year]  Age: 53 y.o. MRN: [id number]  Chief Complaint  Patient presents with   OFFICE VISIT     Medication management RX refill lisinopril . PT C/O of left hand numbness and paim that will come and go.     HPI Encounter Diagnoses  Name Primary?   Healthcare maintenance Yes   Essential hypertension    Pain in both hands    Screening for colon cancer    Screening for prostate cancer    Screening for diabetes mellitus    Tobacco use    Screening for lung cancer    Screening for cholesterol level    Peyronie disease    Here for follow-up.  Blood work for physical was ordered last visit but he forgot to come back to have it done.  He is fasting today.  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  Continues with left greater than right ongoing hand pain.  This is periodic numbness in his left arm.  He is right-hand dominant.  He had been to sports medicine for a few visits and tried on nonsteroidals and a Dosepak.  These seem to help somewhat but his symptoms persist.  He lives with his wife.  He does have ongoing dental care.  Continues to smoke a half a pack a day.  Continues to maintain his sobriety.  He recently noticed a curvature to the right with erection.  It has not been painful.  He has noted issues with urine flow or ED. A1c elevated into the low prediabetic range 4 years ago.  He knows of no family history of diabetes.   Review of Systems  Constitutional: Negative.   HENT: Negative.    Eyes:  Negative for blurred vision, discharge and redness.  Respiratory: Negative.    Cardiovascular: Negative.   Gastrointestinal:  Negative for abdominal pain.  Genitourinary: Negative.   Musculoskeletal:  Positive for joint pain. Negative for myalgias.  Skin:  Negative for rash.  Neurological:  Negative for tingling, loss of consciousness and weakness.  Endo/Heme/Allergies:  Negative for  polydipsia.      04/07/2023   11:11 AM 05/01/2020    4:07 PM 09/14/2019    4:28 PM  Depression screen PHQ 2/9  Decreased Interest 0 2 2  Down, Depressed, Hopeless 0 0 0  PHQ - 2 Score 0 2 2  Altered sleeping   2  Tired, decreased energy   3  Change in appetite   2  Feeling bad or failure about yourself    0  Trouble concentrating   0  Moving slowly or fidgety/restless   0  Suicidal thoughts   0  PHQ-9 Score   9  Difficult doing work/chores   Not difficult at all      Current Outpatient Medications:    lisinopril  (ZESTRIL ) 20 MG tablet, Take 1 tablet (20 mg total) by mouth daily., Disp: 90 tablet, Rfl: 0   Objective:     BP 128/71 (BP Location: Left Arm, Patient Position: Sitting, Cuff Size: Large)   Pulse 60   Temp 97.9 F (36.6 C) (Temporal)   Resp 18   Wt 167 lb 9.6 oz (76 kg)   SpO2 99%   BMI 27.89 kg/m    Physical Exam Constitutional:      General: He is not in acute distress.    Appearance: Normal appearance. He is not ill-appearing, toxic-appearing  or diaphoretic.  HENT:     Head: Normocephalic and atraumatic.     Right Ear: External ear normal.     Left Ear: External ear normal.     Mouth/Throat:     Mouth: Mucous membranes are moist.     Pharynx: Oropharynx is clear. No oropharyngeal exudate or posterior oropharyngeal erythema.  Eyes:     General: No scleral icterus.       Right eye: No discharge.        Left eye: No discharge.     Extraocular Movements: Extraocular movements intact.     Conjunctiva/sclera: Conjunctivae normal.     Pupils: Pupils are equal, round, and reactive to light.  Cardiovascular:     Rate and Rhythm: Normal rate and regular rhythm.  Pulmonary:     Effort: Pulmonary effort is normal. No respiratory distress.     Breath sounds: Normal breath sounds.  Abdominal:     General: Bowel sounds are normal.     Tenderness: There is no abdominal tenderness. There is no guarding or rebound.     Hernia: There is no hernia in the left  inguinal area or right inguinal area.  Genitourinary:    Penis: Circumcised. No hypospadias, erythema, tenderness, discharge, swelling or lesions.      Testes:        Right: Mass, tenderness or swelling not present. Right testis is descended.        Left: Mass, tenderness or swelling not present. Left testis is descended.     Epididymis:     Right: Not inflamed or enlarged.     Left: Not inflamed or enlarged.  Musculoskeletal:     Cervical back: No rigidity or tenderness.  Lymphadenopathy:     Lower Body: No right inguinal adenopathy. No left inguinal adenopathy.  Skin:    General: Skin is warm and dry.  Neurological:     Mental Status: He is alert and oriented to person, place, and time.  Psychiatric:        Mood and Affect: Mood normal.        Behavior: Behavior normal.      Results for orders placed or performed in visit on 04/07/23  CBC  Result Value Ref Range   WBC 8.0 4.0 - 10.5 K/uL   RBC 4.47 4.22 - 5.81 Mil/uL   Platelets 177.0 150.0 - 400.0 K/uL   Hemoglobin 14.9 13.0 - 17.0 g/dL   HCT 55.6 60.9 - 47.9 %   MCV 99.2 78.0 - 100.0 fl   MCHC 33.7 30.0 - 36.0 g/dL   RDW 86.5 88.4 - 84.4 %  Comprehensive metabolic panel  Result Value Ref Range   Sodium 142 135 - 145 mEq/L   Potassium 4.1 3.5 - 5.1 mEq/L   Chloride 105 96 - 112 mEq/L   CO2 29 19 - 32 mEq/L   Glucose, Bld 100 (H) 70 - 99 mg/dL   BUN 13 6 - 23 mg/dL   Creatinine, Ser 9.04 0.40 - 1.50 mg/dL   Total Bilirubin 0.3 0.2 - 1.2 mg/dL   Alkaline Phosphatase 33 (L) 39 - 117 U/L   AST 12 0 - 37 U/L   ALT 11 0 - 53 U/L   Total Protein 6.9 6.0 - 8.3 g/dL   Albumin 4.5 3.5 - 5.2 g/dL   GFR 07.39 >39.99 mL/min   Calcium 9.8 8.4 - 10.5 mg/dL  Hemoglobin J8r  Result Value Ref Range   Hgb A1c MFr Bld 6.1 4.6 -  6.5 %  Lipid panel  Result Value Ref Range   Cholesterol 190 0 - 200 mg/dL   Triglycerides 861.9 0.0 - 149.0 mg/dL   HDL 60.09 >60.99 mg/dL   VLDL 72.3 0.0 - 59.9 mg/dL   LDL Cholesterol 877 (H) 0 -  99 mg/dL   Total CHOL/HDL Ratio 5    NonHDL 149.72   PSA  Result Value Ref Range   PSA 1.28 0.10 - 4.00 ng/mL  Urinalysis, Routine w reflex microscopic  Result Value Ref Range   Color, Urine YELLOW Yellow;Lt. Yellow;Straw;Dark Yellow;Amber;Green;Red;Brown   APPearance CLEAR Clear;Turbid;Slightly Cloudy;Cloudy   Specific Gravity, Urine 1.025 1.000 - 1.030   pH 6.0 5.0 - 8.0   Total Protein, Urine NEGATIVE Negative   Urine Glucose NEGATIVE Negative   Ketones, ur NEGATIVE Negative   Bilirubin Urine NEGATIVE Negative   Hgb urine dipstick NEGATIVE Negative   Urobilinogen, UA 0.2 0.0 - 1.0   Leukocytes,Ua NEGATIVE Negative   Nitrite NEGATIVE Negative   WBC, UA 0-2/hpf 0-2/hpf   RBC / HPF 0-2/hpf 0-2/hpf   Mucus, UA Presence of (A) None   Squamous Epithelial / HPF Rare(0-4/hpf) Rare(0-4/hpf)      The 10-year ASCVD risk score (Arnett DK, et al., 2019) is: 11.1%    Assessment & Plan:   Healthcare maintenance  Essential hypertension -     CBC -     Comprehensive metabolic panel -     Urinalysis, Routine w reflex microscopic -     Lisinopril ; Take 1 tablet (20 mg total) by mouth daily.  Dispense: 90 tablet; Refill: 0  Pain in both hands -     Ambulatory referral to Orthopedic Surgery  Screening for colon cancer -     Ambulatory referral to Gastroenterology  Screening for prostate cancer -     PSA  Screening for diabetes mellitus -     Comprehensive metabolic panel -     Hemoglobin A1c  Tobacco use  Screening for lung cancer -     Ambulatory Referral for Lung Cancer Scre  Screening for cholesterol level -     Comprehensive metabolic panel -     Lipid panel  Peyronie disease    Return in about 3 months (around 07/06/2023).  Information was given on health maintenance and disease prevention.  Encouraged him to quit smoking.  Information was given on how to do just that.  Information was given on Peyronie's disease.  He would like to hold off with a referral for  now.  He will let me know if things change.  Referred for lung cancer screening with low-dose CT.  Referred to GI for consultation for colonoscopy.  Referred to hand surgeon for bilateral ongoing hand pain.  Due to multiple issues being covered above would like to see him back in 3 months.  Elsie Sim Lent, MD   1/14 addendum: Orthopedics notes that they called him 3 times to schedule an appointment and he did not respond to any of them.

## 2023-04-08 MED ORDER — LISINOPRIL 20 MG PO TABS: 20.0000 mg | ORAL_TABLET | Freq: Every day | ORAL | 0 refills | Status: DC

## 2023-04-08 NOTE — Addendum Note (Signed)
 Addended by: Andrez Grime on: 04/08/2023 08:08 AM   Modules accepted: Orders

## 2023-04-28 ENCOUNTER — Encounter: Payer: Self-pay | Admitting: Internal Medicine

## 2023-04-28 ENCOUNTER — Telehealth (INDEPENDENT_AMBULATORY_CARE_PROVIDER_SITE_OTHER): Payer: Commercial Managed Care - PPO | Admitting: Internal Medicine

## 2023-04-28 DIAGNOSIS — J029 Acute pharyngitis, unspecified: Secondary | ICD-10-CM

## 2023-04-28 DIAGNOSIS — R6889 Other general symptoms and signs: Secondary | ICD-10-CM | POA: Diagnosis not present

## 2023-04-28 MED ORDER — OSELTAMIVIR PHOSPHATE 75 MG PO CAPS
75.0000 mg | ORAL_CAPSULE | Freq: Two times a day (BID) | ORAL | 0 refills | Status: AC
Start: 2023-04-28 — End: 2023-05-03

## 2023-04-28 NOTE — Progress Notes (Signed)
[facility] [address] [place] [address] Dept: [phone] Dept Fax: [phone]  Virtual Video Visit  I connected with Anastasio Auerbach Gater on 04/28/23 at 10:20 AM EST by a video enabled telemedicine application and verified that I am speaking with the correct person using two identifiers.   Location patient: Home Location provider: Clinic Total time: 7 minutes Persons participating in the virtual visit: Patient; Mary Sella CMA; Salvatore Decent, FNP-C  I discussed the limitations of evaluation and management by telemedicine and the availability of in-person appointments. The patient expressed understanding and agreed to proceed.  Chief Complaint  Patient presents with   Generalized Body Aches   Cough    Started last night -patient thinks he may have the Flu    SUBJECTIVE:  HPI:  CHRISTEN BEDOYA presents complaining of body aches, sore throat, cough Onset: 1 day ago  Associated symptoms: chills Denies: runny nose, nasal congestion, abdminal pain, N/V/D Treatments tried: ibuprofen   Has been exposed to people with confirmed influenza.   The following portions of the patient's history were reviewed and updated as appropriate: medical history, surgical history, medications, allergies, social history, and family history.    Past Medical History:  Diagnosis Date   Chicken pox    Past Surgical History:  Procedure Laterality Date   VASECTOMY       Current Outpatient Medications:    lisinopril (ZESTRIL) 20 MG tablet, Take 1 tablet (20 mg total) by mouth daily., Disp: 90 tablet, Rfl: 0   oseltamivir (TAMIFLU) 75 MG capsule, Take 1 capsule (75 mg total) by mouth 2 (two) times daily for 5 days., Disp: 10 capsule, Rfl: 0 No Known Allergies  Social History   Socioeconomic History   Marital status: Married    Spouse name: Not on file   Number of children: Not on file   Years of education: Not on file   Highest  education level: Not on file  Occupational History   Not on file  Tobacco Use   Smoking status: Every Day    Average packs/day: 0.7 packs/day for 44.0 years (32.0 ttl pk-yrs)    Types: Cigarettes    Start date: 2000   Smokeless tobacco: Never  Vaping Use   Vaping status: Some Days  Substance and Sexual Activity   Alcohol use: Not Currently    Alcohol/week: 4.0 standard drinks of alcohol    Types: 4 Cans of beer per week   Drug use: No   Sexual activity: Yes  Other Topics Concern   Not on file  Social History Narrative   Not on file   Social Drivers of Health   Financial Resource Strain: Not on file  Food Insecurity: Not on file  Transportation Needs: Not on file  Physical Activity: Not on file  Stress: Not on file  Social Connections: Not on file  Intimate Partner Violence: Not on file    Family History  Problem Relation Age of Onset   Diabetes Mother    Hypertension Mother    Heart disease Father    Hypertension Father    Alcohol abuse Maternal Grandfather    Hypertension Brother    Stroke Brother      ROS: A complete ROS was performed with pertinent positives/negatives noted in the HPI. The remainder of the ROS are negative.    OBJECTIVE:  VITALS per patient if applicable: There were no vitals filed for this visit. There is no height or weight on file  to calculate BMI.   GENERAL: Alert and oriented. Appears well and in no acute distress. HEENT: Atraumatic. Conjunctiva clear. No obvious abnormalities on inspection of external nose and ears. NECK: Normal movements of the head and neck. LUNGS: On inspection, no signs of respiratory distress. Breathing rate appears normal. No obvious gross SOB, gasping or wheezing, and no conversational dyspnea. CV: No obvious cyanosis. MS: Moves all visible extremities without noticeable abnormality. PSYCH/NEURO: Pleasant and cooperative. No obvious depression or anxiety. Speech and thought processing grossly  intact.  ASSESSMENT AND PLAN: 1. Flu-like symptoms (Primary) - oseltamivir (TAMIFLU) 75 MG capsule; Take 1 capsule (75 mg total) by mouth 2 (two) times daily for 5 days.  Dispense: 10 capsule; Refill: 0 - discussed supportive care with Coricidin HBP cough, throat lozenges, chloraseptic throat spray, tylenol/ibuprofen  - rest, drink plenty of fluids  - patient made aware of concerning/alarming symptoms of CP, SHOB and to be seen in office or go to ER if symptoms occur.    I discussed the assessment and treatment plan with the patient. The patient was provided an opportunity to ask questions and all were answered. The patient agreed with the plan and demonstrated an understanding of the instructions.   The patient was advised to call back or seek an in-person evaluation if the symptoms worsen or if the condition fails to improve as anticipated.  Return if symptoms worsen or fail to improve.  Salvatore Decent, FNP

## 2023-05-16 ENCOUNTER — Encounter: Payer: Self-pay | Admitting: Family Medicine

## 2023-07-07 ENCOUNTER — Ambulatory Visit: Payer: Commercial Managed Care - PPO | Admitting: Family Medicine

## 2023-07-15 ENCOUNTER — Telehealth: Payer: Self-pay | Admitting: Family Medicine

## 2023-07-15 NOTE — Telephone Encounter (Signed)
 01/06/2023 no show 07/07/2023 no show  Final warning letter sent via mail and mychart

## 2023-07-20 ENCOUNTER — Other Ambulatory Visit: Payer: Self-pay | Admitting: Family Medicine

## 2023-07-20 DIAGNOSIS — I10 Essential (primary) hypertension: Secondary | ICD-10-CM

## 2023-09-19 ENCOUNTER — Other Ambulatory Visit: Payer: Self-pay | Admitting: Family Medicine

## 2023-09-19 DIAGNOSIS — I10 Essential (primary) hypertension: Secondary | ICD-10-CM

## 2023-09-23 ENCOUNTER — Other Ambulatory Visit: Payer: Self-pay | Admitting: Family Medicine

## 2023-09-23 DIAGNOSIS — I10 Essential (primary) hypertension: Secondary | ICD-10-CM

## 2023-09-24 ENCOUNTER — Other Ambulatory Visit: Payer: Self-pay | Admitting: Family Medicine

## 2023-09-24 DIAGNOSIS — I10 Essential (primary) hypertension: Secondary | ICD-10-CM

## 2023-09-26 ENCOUNTER — Encounter: Payer: Self-pay | Admitting: Family Medicine

## 2023-09-26 ENCOUNTER — Ambulatory Visit (INDEPENDENT_AMBULATORY_CARE_PROVIDER_SITE_OTHER): Admitting: Family Medicine

## 2023-09-26 VITALS — BP 122/72 | HR 60 | Temp 97.1°F | Ht 63.0 in | Wt 161.0 lb

## 2023-09-26 DIAGNOSIS — G5602 Carpal tunnel syndrome, left upper limb: Secondary | ICD-10-CM

## 2023-09-26 DIAGNOSIS — Z720 Tobacco use: Secondary | ICD-10-CM

## 2023-09-26 DIAGNOSIS — Z23 Encounter for immunization: Secondary | ICD-10-CM

## 2023-09-26 NOTE — Progress Notes (Addendum)
 Established Patient Office Visit   Subjective:  Patient ID: Philip Hart, male    DOB: [birth year]  Age: 53 y.o. MRN: [id number]  Chief Complaint  Patient presents with   left arm     Tingling in arm and hand when reach above head taking ibuprofen and tylenol helps but don't help     HPI Encounter Diagnoses  Name Primary?   Immunization due Yes   Tobacco use    Carpal tunnel syndrome of left wrist    For evaluation of numbness and tingling in his left hand.  He says that all of the fingers are involved.  There is no tingling on the back of the hand but there is some tingling in the front end.  Tingling is worse when he raises his arm above his shoulder.  He denies neck pain.  He is right-hand dominant.  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  Continues to smoke.  He is not ready to quit at this time.  He has tried Chantix in the past and did not have side effects.   Review of Systems  Constitutional: Negative.   HENT: Negative.    Eyes:  Negative for blurred vision, discharge and redness.  Respiratory: Negative.    Cardiovascular: Negative.   Gastrointestinal:  Negative for abdominal pain.  Genitourinary: Negative.   Musculoskeletal: Negative.  Negative for myalgias and neck pain.  Skin:  Negative for rash.  Neurological:  Positive for tingling. Negative for loss of consciousness and weakness.  Endo/Heme/Allergies:  Negative for polydipsia.     Current Outpatient Medications:    lisinopril  (ZESTRIL ) 20 MG tablet, TAKE 1 TABLET BY MOUTH EVERY DAY, Disp: 30 tablet, Rfl: 0   Objective:     BP 122/72 (BP Location: Right Arm, Patient Position: Sitting, Cuff Size: Normal)   Pulse 60   Temp (!) 97.1 F (36.2 C)   Ht 5' 3 (1.6 m)   Wt 161 lb (73 kg)   SpO2 94%   BMI 28.52 kg/m    Physical Exam Constitutional:      General: He is not in acute distress.    Appearance: Normal appearance. He is not ill-appearing, toxic-appearing or diaphoretic.  HENT:     Head:  Normocephalic and atraumatic.     Right Ear: External ear normal.     Left Ear: External ear normal.     Mouth/Throat:     Mouth: Mucous membranes are moist.     Pharynx: Oropharynx is clear. No oropharyngeal exudate or posterior oropharyngeal erythema.   Eyes:     General: No scleral icterus.       Right eye: No discharge.        Left eye: No discharge.     Extraocular Movements: Extraocular movements intact.     Conjunctiva/sclera: Conjunctivae normal.     Pupils: Pupils are equal, round, and reactive to light.    Cardiovascular:     Rate and Rhythm: Normal rate and regular rhythm.  Pulmonary:     Effort: Pulmonary effort is normal. No respiratory distress.     Breath sounds: Normal breath sounds.  Abdominal:     General: Bowel sounds are normal.     Tenderness: There is no abdominal tenderness. There is no guarding.   Musculoskeletal:     Left wrist: No tenderness. Normal range of motion.       Arms:     Cervical back: No rigidity, tenderness or bony tenderness. No pain with  movement. Normal range of motion.       Back:   Skin:    General: Skin is warm and dry.   Neurological:     Mental Status: He is alert and oriented to person, place, and time.   Psychiatric:        Mood and Affect: Mood normal.        Behavior: Behavior normal.      No results found for any visits on 09/26/23.    The 10-year ASCVD risk score (Arnett DK, et al., 2019) is: 10.8%    Assessment & Plan:   Immunization due -     Varicella-zoster vaccine IM  Tobacco use -     Ambulatory Referral for Lung Cancer Scre  Carpal tunnel syndrome of left wrist -     Ambulatory referral to Orthopedic Surgery    Return in about 7 months (around 04/27/2024), or if symptoms worsen or fail to improve.  Start the Shingrix  vaccine series.  Information was given.  Orthopedic referral for probable carpal tunnel syndrome.  Information about Chantix was given.  We discussed side effects.  He has taken  it in the past without issue.  He will pick a quit date, let me know and then start the medication a week prior.  Follow-up with me 1 month after starting.  Elsie Sim Lent, MD  7/29 addendum: Did not respond to calls to schedule his low-dose screening CT of the chest.

## 2023-10-12 ENCOUNTER — Other Ambulatory Visit: Payer: Self-pay | Admitting: Family Medicine

## 2023-10-12 DIAGNOSIS — I10 Essential (primary) hypertension: Secondary | ICD-10-CM

## 2023-10-28 ENCOUNTER — Encounter: Payer: Self-pay | Admitting: Family Medicine

## 2023-11-26 ENCOUNTER — Ambulatory Visit

## 2023-11-28 ENCOUNTER — Ambulatory Visit (INDEPENDENT_AMBULATORY_CARE_PROVIDER_SITE_OTHER)

## 2023-11-28 DIAGNOSIS — Z23 Encounter for immunization: Secondary | ICD-10-CM

## 2023-11-28 NOTE — Progress Notes (Signed)
 Per orders of Dr. Berneta pt is here for 2nd dose of Shingles vaccine. Pt received 0.5 dose shingles vaccine in right at 3:00pm. Given by Ki'Asha S. CMA II, Pt tolerated 2nd dose shingles vaccine well. Series completed.

## 2024-04-27 ENCOUNTER — Ambulatory Visit: Admitting: Family Medicine

## 2024-04-30 ENCOUNTER — Ambulatory Visit: Admitting: Family Medicine

## 2024-05-28 ENCOUNTER — Ambulatory Visit: Admitting: Family Medicine

## 2024-06-07 ENCOUNTER — Ambulatory Visit: Admitting: Family Medicine
